# Patient Record
Sex: Female | Born: 1935 | Race: Black or African American | Hispanic: No | Marital: Single | Smoking: Never smoker
Health system: Southern US, Community
[De-identification: ages and names within clinical notes are randomized; demographics above are authoritative.]

## PROBLEM LIST (undated history)

## (undated) DIAGNOSIS — E785 Hyperlipidemia, unspecified: Secondary | ICD-10-CM

## (undated) DIAGNOSIS — I447 Left bundle-branch block, unspecified: Secondary | ICD-10-CM

## (undated) DIAGNOSIS — G4700 Insomnia, unspecified: Secondary | ICD-10-CM

## (undated) DIAGNOSIS — K5909 Other constipation: Secondary | ICD-10-CM

## (undated) DIAGNOSIS — D649 Anemia, unspecified: Secondary | ICD-10-CM

## (undated) DIAGNOSIS — M1710 Unilateral primary osteoarthritis, unspecified knee: Secondary | ICD-10-CM

## (undated) DIAGNOSIS — A048 Other specified bacterial intestinal infections: Secondary | ICD-10-CM

## (undated) DIAGNOSIS — I10 Essential (primary) hypertension: Secondary | ICD-10-CM

## (undated) DIAGNOSIS — K219 Gastro-esophageal reflux disease without esophagitis: Secondary | ICD-10-CM

## (undated) HISTORY — DX: Insomnia, unspecified: G47.00

## (undated) HISTORY — DX: Hyperlipidemia, unspecified: E78.5

## (undated) HISTORY — DX: Essential (primary) hypertension: I10

## (undated) HISTORY — DX: Gastro-esophageal reflux disease without esophagitis: K21.9

## (undated) HISTORY — DX: Anemia, unspecified: D64.9

## (undated) HISTORY — DX: Unilateral primary osteoarthritis, unspecified knee: M17.10

## (undated) HISTORY — DX: Other constipation: K59.09

## (undated) HISTORY — DX: Left bundle-branch block, unspecified: I44.7

## (undated) HISTORY — DX: Other specified bacterial intestinal infections: A04.8

## (undated) HISTORY — PX: EYE SURGERY: SHX253

---

## 1972-06-16 HISTORY — PX: BREAST LUMPECTOMY: SHX2

## 1982-06-16 HISTORY — PX: TOTAL ABDOMINAL HYSTERECTOMY W/ BILATERAL SALPINGOOPHORECTOMY: SHX83

## 2001-07-12 ENCOUNTER — Other Ambulatory Visit: Admission: RE | Admit: 2001-07-12 | Discharge: 2001-07-12 | Payer: Self-pay | Admitting: Family Medicine

## 2001-07-26 ENCOUNTER — Ambulatory Visit (HOSPITAL_COMMUNITY): Admission: RE | Admit: 2001-07-26 | Discharge: 2001-07-26 | Payer: Self-pay | Admitting: Family Medicine

## 2001-07-26 ENCOUNTER — Encounter: Payer: Self-pay | Admitting: Family Medicine

## 2001-08-19 ENCOUNTER — Encounter: Payer: Self-pay | Admitting: Family Medicine

## 2001-08-19 ENCOUNTER — Ambulatory Visit (HOSPITAL_COMMUNITY): Admission: RE | Admit: 2001-08-19 | Discharge: 2001-08-19 | Payer: Self-pay | Admitting: Family Medicine

## 2002-06-16 HISTORY — PX: OTHER SURGICAL HISTORY: SHX169

## 2002-07-14 ENCOUNTER — Ambulatory Visit (HOSPITAL_BASED_OUTPATIENT_CLINIC_OR_DEPARTMENT_OTHER): Admission: RE | Admit: 2002-07-14 | Discharge: 2002-07-14 | Payer: Self-pay | Admitting: Orthopedic Surgery

## 2002-07-28 ENCOUNTER — Ambulatory Visit (HOSPITAL_COMMUNITY): Admission: RE | Admit: 2002-07-28 | Discharge: 2002-07-28 | Payer: Self-pay | Admitting: Family Medicine

## 2002-07-28 ENCOUNTER — Encounter: Payer: Self-pay | Admitting: Family Medicine

## 2002-08-10 ENCOUNTER — Ambulatory Visit (HOSPITAL_BASED_OUTPATIENT_CLINIC_OR_DEPARTMENT_OTHER): Admission: RE | Admit: 2002-08-10 | Discharge: 2002-08-10 | Payer: Self-pay | Admitting: Orthopedic Surgery

## 2002-08-29 ENCOUNTER — Ambulatory Visit (HOSPITAL_COMMUNITY): Admission: RE | Admit: 2002-08-29 | Discharge: 2002-08-29 | Payer: Self-pay | Admitting: Family Medicine

## 2002-08-29 ENCOUNTER — Encounter: Payer: Self-pay | Admitting: Family Medicine

## 2002-09-14 ENCOUNTER — Encounter (HOSPITAL_COMMUNITY): Admission: RE | Admit: 2002-09-14 | Discharge: 2002-10-14 | Payer: Self-pay | Admitting: *Deleted

## 2003-08-01 ENCOUNTER — Ambulatory Visit (HOSPITAL_COMMUNITY): Admission: RE | Admit: 2003-08-01 | Discharge: 2003-08-01 | Payer: Self-pay | Admitting: Family Medicine

## 2003-08-25 ENCOUNTER — Ambulatory Visit (HOSPITAL_COMMUNITY): Admission: RE | Admit: 2003-08-25 | Discharge: 2003-08-25 | Payer: Self-pay | Admitting: Family Medicine

## 2004-06-16 HISTORY — PX: OTHER SURGICAL HISTORY: SHX169

## 2004-08-23 ENCOUNTER — Ambulatory Visit: Payer: Self-pay | Admitting: Family Medicine

## 2004-08-27 ENCOUNTER — Ambulatory Visit (HOSPITAL_COMMUNITY): Admission: RE | Admit: 2004-08-27 | Discharge: 2004-08-27 | Payer: Self-pay | Admitting: Family Medicine

## 2004-09-04 ENCOUNTER — Ambulatory Visit (HOSPITAL_COMMUNITY): Admission: RE | Admit: 2004-09-04 | Discharge: 2004-09-04 | Payer: Self-pay | Admitting: Family Medicine

## 2004-09-16 ENCOUNTER — Ambulatory Visit (HOSPITAL_COMMUNITY): Admission: RE | Admit: 2004-09-16 | Discharge: 2004-09-16 | Payer: Self-pay | Admitting: General Surgery

## 2004-12-26 ENCOUNTER — Ambulatory Visit: Payer: Self-pay | Admitting: Family Medicine

## 2005-03-12 ENCOUNTER — Ambulatory Visit: Payer: Self-pay | Admitting: Family Medicine

## 2005-03-21 ENCOUNTER — Ambulatory Visit (HOSPITAL_COMMUNITY): Admission: RE | Admit: 2005-03-21 | Discharge: 2005-03-21 | Payer: Self-pay | Admitting: *Deleted

## 2005-04-01 ENCOUNTER — Ambulatory Visit: Payer: Self-pay | Admitting: Family Medicine

## 2005-04-21 ENCOUNTER — Ambulatory Visit: Payer: Self-pay | Admitting: Orthopedic Surgery

## 2005-07-08 ENCOUNTER — Ambulatory Visit: Payer: Self-pay | Admitting: Family Medicine

## 2005-09-05 ENCOUNTER — Ambulatory Visit (HOSPITAL_COMMUNITY): Admission: RE | Admit: 2005-09-05 | Discharge: 2005-09-05 | Payer: Self-pay | Admitting: Family Medicine

## 2005-12-29 ENCOUNTER — Other Ambulatory Visit: Admission: RE | Admit: 2005-12-29 | Discharge: 2005-12-29 | Payer: Self-pay | Admitting: Family Medicine

## 2005-12-29 ENCOUNTER — Ambulatory Visit: Payer: Self-pay | Admitting: Family Medicine

## 2005-12-29 ENCOUNTER — Encounter (INDEPENDENT_AMBULATORY_CARE_PROVIDER_SITE_OTHER): Payer: Self-pay | Admitting: *Deleted

## 2006-03-26 ENCOUNTER — Ambulatory Visit: Payer: Self-pay | Admitting: Family Medicine

## 2006-06-16 HISTORY — PX: COLONOSCOPY: SHX174

## 2006-06-18 ENCOUNTER — Ambulatory Visit: Payer: Self-pay | Admitting: Family Medicine

## 2006-09-10 ENCOUNTER — Ambulatory Visit (HOSPITAL_COMMUNITY): Admission: RE | Admit: 2006-09-10 | Discharge: 2006-09-10 | Payer: Self-pay | Admitting: Family Medicine

## 2006-12-03 ENCOUNTER — Encounter: Payer: Self-pay | Admitting: Family Medicine

## 2006-12-03 LAB — CONVERTED CEMR LAB
AST: 18 units/L (ref 0–37)
Albumin: 4.3 g/dL (ref 3.5–5.2)
Alkaline Phosphatase: 64 units/L (ref 39–117)
Cholesterol: 190 mg/dL (ref 0–200)
HDL: 69 mg/dL (ref >39)
Indirect Bilirubin: 0.2 mg/dL (ref 0.0–0.9)
Total Protein: 7.3 g/dL (ref 6.0–8.3)
Triglycerides: 63 mg/dL (ref <150)

## 2007-01-14 ENCOUNTER — Ambulatory Visit: Payer: Self-pay | Admitting: Family Medicine

## 2007-01-14 ENCOUNTER — Other Ambulatory Visit: Admission: RE | Admit: 2007-01-14 | Discharge: 2007-01-14 | Payer: Self-pay | Admitting: Family Medicine

## 2007-01-15 ENCOUNTER — Encounter (INDEPENDENT_AMBULATORY_CARE_PROVIDER_SITE_OTHER): Payer: Self-pay | Admitting: *Deleted

## 2007-01-15 ENCOUNTER — Encounter: Payer: Self-pay | Admitting: Family Medicine

## 2007-06-17 ENCOUNTER — Encounter: Payer: Self-pay | Admitting: Family Medicine

## 2007-06-17 HISTORY — PX: OTHER SURGICAL HISTORY: SHX169

## 2007-07-02 ENCOUNTER — Ambulatory Visit: Payer: Self-pay | Admitting: Family Medicine

## 2007-07-02 ENCOUNTER — Ambulatory Visit (HOSPITAL_COMMUNITY): Admission: RE | Admit: 2007-07-02 | Discharge: 2007-07-02 | Payer: Self-pay | Admitting: Family Medicine

## 2007-07-02 LAB — CONVERTED CEMR LAB
AST: 19 units/L (ref 0–37)
Albumin: 4.6 g/dL (ref 3.5–5.2)
Alkaline Phosphatase: 55 units/L (ref 39–117)
Basophils Absolute: 0 10*3/uL (ref 0.0–0.1)
Basophils Relative: 0 % (ref 0–1)
CO2: 25 meq/L (ref 19–32)
Calcium: 9.6 mg/dL (ref 8.4–10.5)
Chloride: 104 meq/L (ref 96–112)
Creatinine, Ser: 1.12 mg/dL (ref 0.40–1.20)
Eosinophils Relative: 1 % (ref 0–5)
HDL: 66 mg/dL (ref >39)
Lymphocytes Relative: 41 % (ref 12–46)
MCHC: 33.7 g/dL (ref 30.0–36.0)
Neutro Abs: 1.8 10*3/uL (ref 1.7–7.7)
Platelets: 279 10*3/uL (ref 150–400)
RDW: 12.7 % (ref 11.5–15.5)
Sodium: 141 meq/L (ref 135–145)
Total Bilirubin: 0.6 mg/dL (ref 0.3–1.2)
Total CHOL/HDL Ratio: 2.7
Total Protein: 7.8 g/dL (ref 6.0–8.3)
Triglycerides: 67 mg/dL (ref <150)

## 2007-09-23 ENCOUNTER — Ambulatory Visit (HOSPITAL_COMMUNITY): Admission: RE | Admit: 2007-09-23 | Discharge: 2007-09-23 | Payer: Self-pay | Admitting: Family Medicine

## 2007-10-21 ENCOUNTER — Encounter: Payer: Self-pay | Admitting: Family Medicine

## 2007-10-21 LAB — CONVERTED CEMR LAB
AST: 20 units/L (ref 0–37)
Albumin: 4.2 g/dL (ref 3.5–5.2)
Alkaline Phosphatase: 54 units/L (ref 39–117)
Basophils Absolute: 0 10*3/uL (ref 0.0–0.1)
CO2: 25 meq/L (ref 19–32)
Calcium: 9.9 mg/dL (ref 8.4–10.5)
Chloride: 106 meq/L (ref 96–112)
Glucose, Bld: 88 mg/dL (ref 70–99)
HCT: 35.1 % — ABNORMAL LOW (ref 36.0–46.0)
Indirect Bilirubin: 0.3 mg/dL (ref 0.0–0.9)
Lymphocytes Relative: 45 % (ref 12–46)
Lymphs Abs: 1.4 10*3/uL (ref 0.7–4.0)
Neutro Abs: 1.3 10*3/uL — ABNORMAL LOW (ref 1.7–7.7)
Neutrophils Relative %: 42 % — ABNORMAL LOW (ref 43–77)
Platelets: 245 10*3/uL (ref 150–400)
Potassium: 4.3 meq/L (ref 3.5–5.3)
RDW: 13.4 % (ref 11.5–15.5)
Sodium: 142 meq/L (ref 135–145)
Total Protein: 7.1 g/dL (ref 6.0–8.3)
WBC: 3.2 10*3/uL — ABNORMAL LOW (ref 4.0–10.5)

## 2007-10-27 ENCOUNTER — Ambulatory Visit: Payer: Self-pay | Admitting: Family Medicine

## 2007-11-02 ENCOUNTER — Encounter (INDEPENDENT_AMBULATORY_CARE_PROVIDER_SITE_OTHER): Payer: Self-pay | Admitting: *Deleted

## 2007-11-02 DIAGNOSIS — M1710 Unilateral primary osteoarthritis, unspecified knee: Secondary | ICD-10-CM | POA: Insufficient documentation

## 2007-11-02 DIAGNOSIS — G4700 Insomnia, unspecified: Secondary | ICD-10-CM

## 2007-11-02 DIAGNOSIS — E785 Hyperlipidemia, unspecified: Secondary | ICD-10-CM

## 2007-11-02 DIAGNOSIS — I10 Essential (primary) hypertension: Secondary | ICD-10-CM

## 2007-11-02 DIAGNOSIS — IMO0002 Reserved for concepts with insufficient information to code with codable children: Secondary | ICD-10-CM

## 2007-11-02 DIAGNOSIS — D649 Anemia, unspecified: Secondary | ICD-10-CM

## 2007-11-24 ENCOUNTER — Ambulatory Visit (HOSPITAL_COMMUNITY): Payer: Self-pay | Admitting: Oncology

## 2007-11-24 ENCOUNTER — Encounter (HOSPITAL_COMMUNITY): Admission: RE | Admit: 2007-11-24 | Discharge: 2007-12-24 | Payer: Self-pay | Admitting: Oncology

## 2008-02-02 ENCOUNTER — Encounter: Payer: Self-pay | Admitting: Family Medicine

## 2008-02-07 ENCOUNTER — Telehealth: Payer: Self-pay | Admitting: Family Medicine

## 2008-02-15 HISTORY — PX: NM MYOVIEW LTD: HXRAD82

## 2008-02-15 HISTORY — PX: DOPPLER ECHOCARDIOGRAPHY: SHX263

## 2008-02-24 ENCOUNTER — Encounter: Payer: Self-pay | Admitting: Family Medicine

## 2008-03-28 ENCOUNTER — Ambulatory Visit: Payer: Self-pay | Admitting: Family Medicine

## 2008-03-28 ENCOUNTER — Ambulatory Visit (HOSPITAL_COMMUNITY): Admission: RE | Admit: 2008-03-28 | Discharge: 2008-03-28 | Payer: Self-pay | Admitting: Family Medicine

## 2008-03-28 DIAGNOSIS — H918X9 Other specified hearing loss, unspecified ear: Secondary | ICD-10-CM | POA: Insufficient documentation

## 2008-03-29 ENCOUNTER — Ambulatory Visit (HOSPITAL_COMMUNITY): Admission: RE | Admit: 2008-03-29 | Discharge: 2008-03-29 | Payer: Self-pay | Admitting: Family Medicine

## 2008-04-11 ENCOUNTER — Encounter: Payer: Self-pay | Admitting: Family Medicine

## 2008-04-18 ENCOUNTER — Encounter: Payer: Self-pay | Admitting: Family Medicine

## 2008-05-22 ENCOUNTER — Telehealth: Payer: Self-pay | Admitting: Family Medicine

## 2008-06-20 ENCOUNTER — Telehealth: Payer: Self-pay | Admitting: Family Medicine

## 2008-07-19 DIAGNOSIS — R5381 Other malaise: Secondary | ICD-10-CM

## 2008-07-19 DIAGNOSIS — R5383 Other fatigue: Secondary | ICD-10-CM

## 2008-07-20 ENCOUNTER — Ambulatory Visit: Payer: Self-pay | Admitting: Family Medicine

## 2008-07-20 LAB — CONVERTED CEMR LAB
AST: 21 units/L (ref 0–37)
BUN: 18 mg/dL (ref 6–23)
Bilirubin, Direct: 0.2 mg/dL (ref 0.0–0.3)
CO2: 26 meq/L (ref 19–32)
Calcium: 9.5 mg/dL (ref 8.4–10.5)
Glucose, Bld: 87 mg/dL (ref 70–99)
Indirect Bilirubin: 0.4 mg/dL (ref 0.0–0.9)
Sodium: 140 meq/L (ref 135–145)
TSH: 2.195 microintl units/mL (ref 0.350–4.50)
Total Bilirubin: 0.6 mg/dL (ref 0.3–1.2)
Total CHOL/HDL Ratio: 2.2
VLDL: 9 mg/dL (ref 0–40)

## 2008-07-24 ENCOUNTER — Telehealth: Payer: Self-pay | Admitting: Family Medicine

## 2008-07-25 ENCOUNTER — Encounter: Payer: Self-pay | Admitting: Family Medicine

## 2008-08-16 ENCOUNTER — Encounter: Payer: Self-pay | Admitting: Family Medicine

## 2008-09-26 ENCOUNTER — Ambulatory Visit (HOSPITAL_COMMUNITY): Admission: RE | Admit: 2008-09-26 | Discharge: 2008-09-26 | Payer: Self-pay | Admitting: Family Medicine

## 2008-11-22 ENCOUNTER — Encounter (HOSPITAL_COMMUNITY): Admission: RE | Admit: 2008-11-22 | Discharge: 2008-12-22 | Payer: Self-pay | Admitting: Oncology

## 2008-11-22 ENCOUNTER — Encounter: Payer: Self-pay | Admitting: Family Medicine

## 2008-11-22 ENCOUNTER — Ambulatory Visit (HOSPITAL_COMMUNITY): Payer: Self-pay | Admitting: Oncology

## 2008-11-23 ENCOUNTER — Ambulatory Visit: Payer: Self-pay | Admitting: Family Medicine

## 2008-11-27 ENCOUNTER — Encounter: Payer: Self-pay | Admitting: Family Medicine

## 2009-02-22 ENCOUNTER — Telehealth: Payer: Self-pay | Admitting: Family Medicine

## 2009-02-22 ENCOUNTER — Encounter: Payer: Self-pay | Admitting: Family Medicine

## 2009-02-22 LAB — CONVERTED CEMR LAB
Albumin: 4 g/dL (ref 3.5–5.2)
CO2: 25 meq/L (ref 19–32)
Chloride: 106 meq/L (ref 96–112)
Glucose, Bld: 82 mg/dL (ref 70–99)
HDL: 74 mg/dL (ref >39)
LDL Cholesterol: 61 mg/dL (ref 0–99)
Sodium: 143 meq/L (ref 135–145)
Total Bilirubin: 0.7 mg/dL (ref 0.3–1.2)
Total CHOL/HDL Ratio: 2

## 2009-03-28 ENCOUNTER — Ambulatory Visit: Payer: Self-pay | Admitting: Family Medicine

## 2009-05-16 HISTORY — PX: OTHER SURGICAL HISTORY: SHX169

## 2009-07-02 ENCOUNTER — Encounter: Payer: Self-pay | Admitting: Family Medicine

## 2009-07-06 LAB — CONVERTED CEMR LAB
Albumin: 4.3 g/dL (ref 3.5–5.2)
BUN: 20 mg/dL (ref 6–23)
CO2: 25 meq/L (ref 19–32)
Chloride: 103 meq/L (ref 96–112)
Eosinophils Relative: 1 % (ref 0–5)
Glucose, Bld: 98 mg/dL (ref 70–99)
HCT: 35.7 % — ABNORMAL LOW (ref 36.0–46.0)
Hemoglobin: 11.8 g/dL — ABNORMAL LOW (ref 12.0–15.0)
Indirect Bilirubin: 0.5 mg/dL (ref 0.0–0.9)
LDL Cholesterol: 108 mg/dL — ABNORMAL HIGH (ref 0–99)
Lymphocytes Relative: 40 % (ref 12–46)
Lymphs Abs: 1.4 10*3/uL (ref 0.7–4.0)
Monocytes Absolute: 0.5 10*3/uL (ref 0.1–1.0)
Monocytes Relative: 13 % — ABNORMAL HIGH (ref 3–12)
Potassium: 3.9 meq/L (ref 3.5–5.3)
RBC: 3.87 M/uL (ref 3.87–5.11)
Total Protein: 7.2 g/dL (ref 6.0–8.3)
Triglycerides: 65 mg/dL (ref <150)
VLDL: 13 mg/dL (ref 0–40)
WBC: 3.6 10*3/uL — ABNORMAL LOW (ref 4.0–10.5)

## 2009-09-14 IMAGING — CT CT ANGIO HEAD
1 of 4 series · 19 of 47 positions shown · IV contrast (Omnipaque 300)
Comparison: MRI 03/28/2008

CLINICAL DATA: Headache.  Possible aneurysm on MRI.

CT ANGIOGRAPHY HEAD
TECHNIQUE: Multidetector CT imaging of the head was performed
using the standard protocol prior to and during bolus
administration of intravenous contrast. Multiplanar CT image
reconstructions including MIPs were obtained to evaluate the
vascular anatomy.
Contrast: 100 ml Ymnipaque-0LL IV

[Series 8: headangio 0.6 h10f · axial · 0.40mm/px · z∈[+107,+235]mm · 19 of 346 slices shown]
[im 13/346  brain]
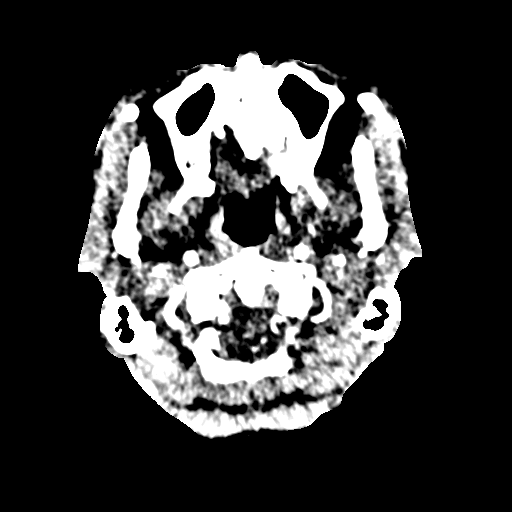
[im 39/346  bone]
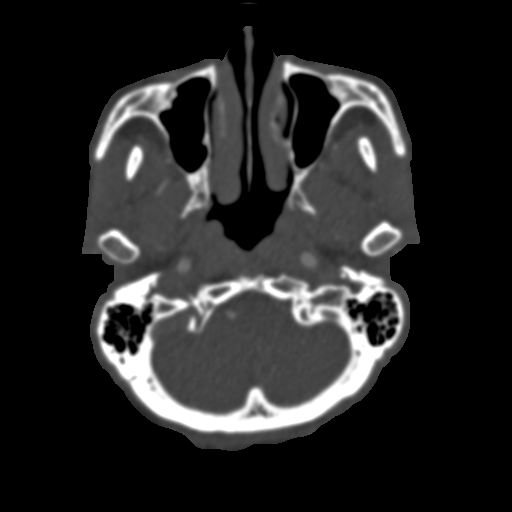
[im 52/346  brain]
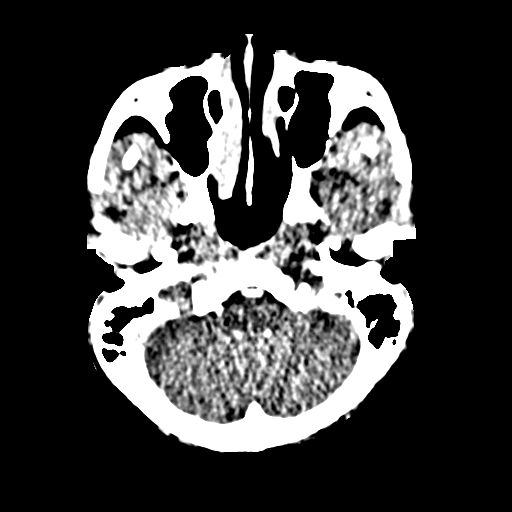
[im 64/346  bone]
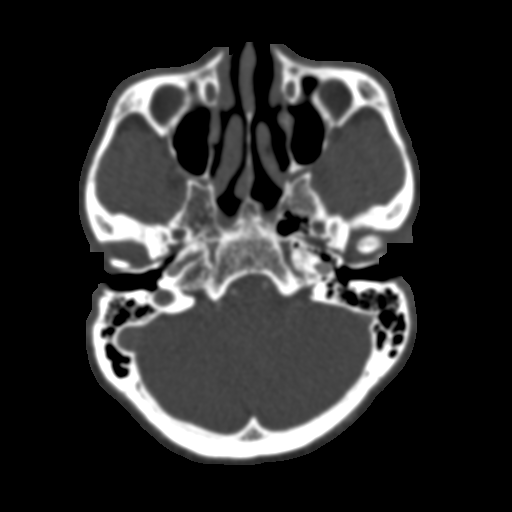
[im 90/346  brain]
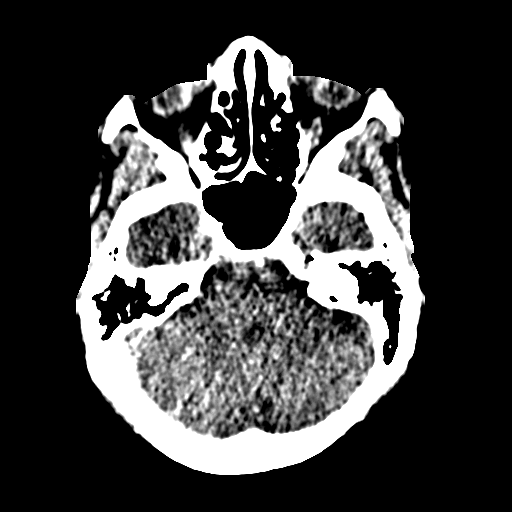
[im 103/346  bone]
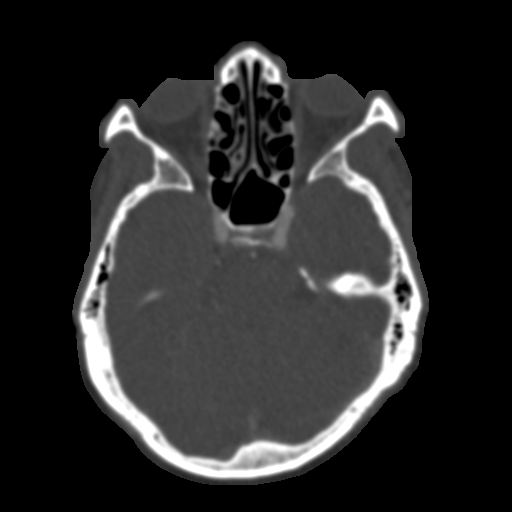
[im 116/346  brain]
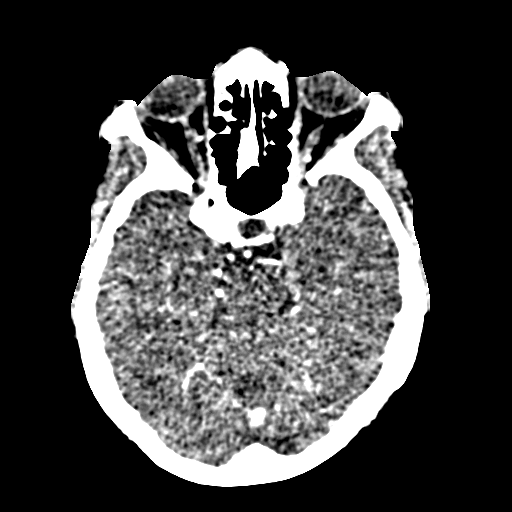
[im 141/346  bone]
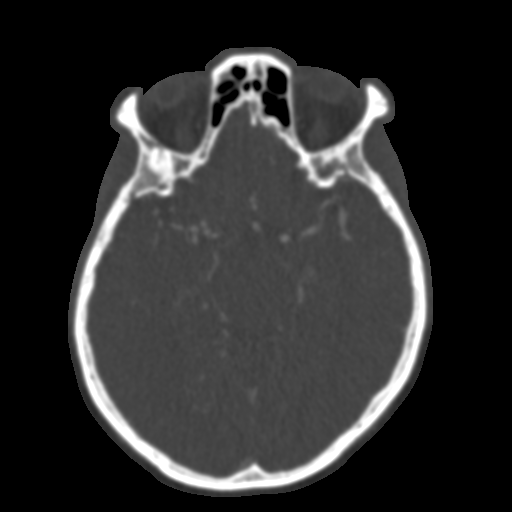
[im 154/346  brain]
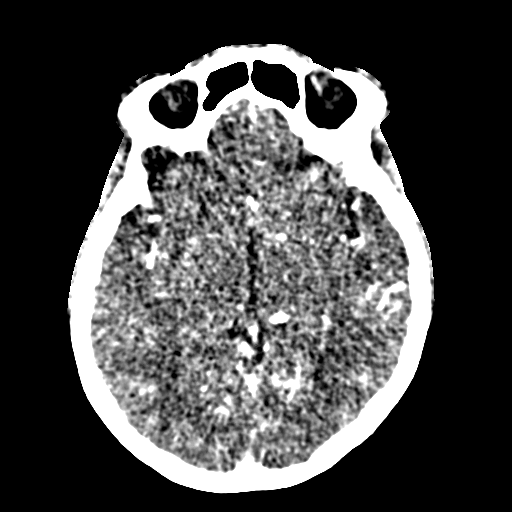
[im 179/346  bone]
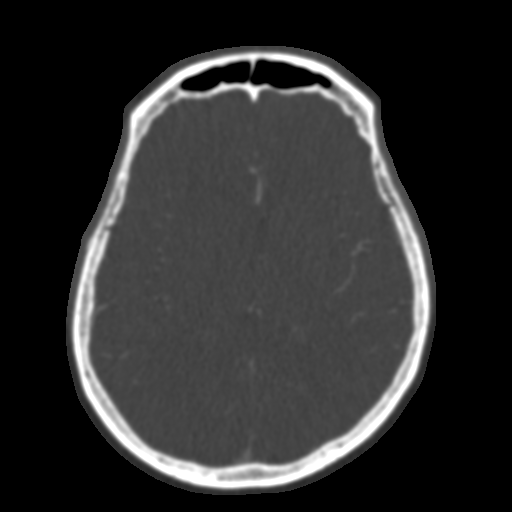
[im 192/346  brain]
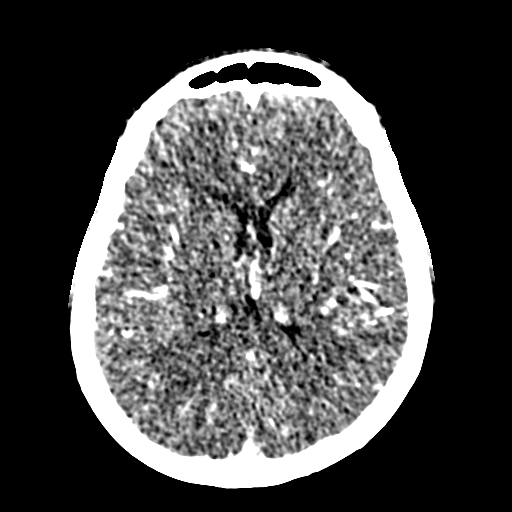
[im 205/346  bone]
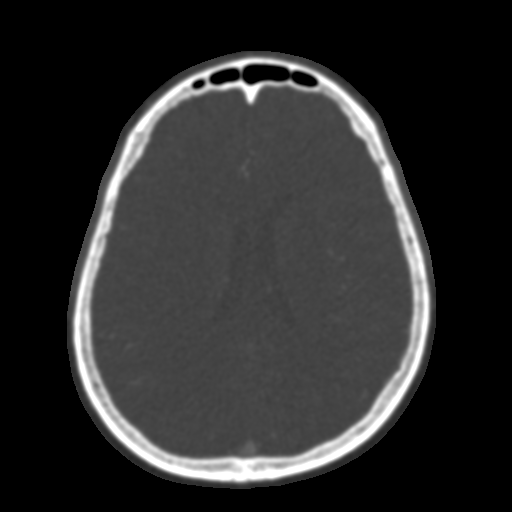
[im 231/346  brain]
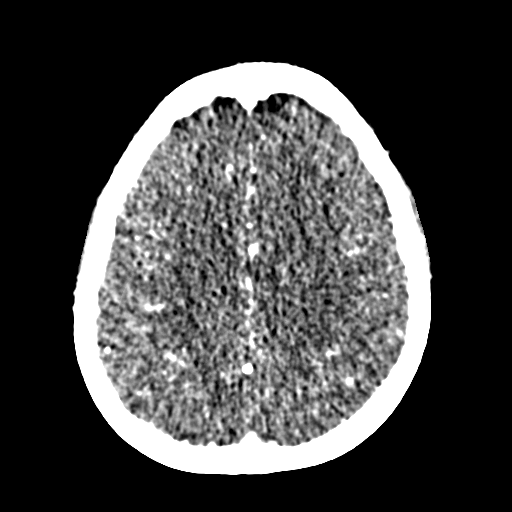
[im 243/346  bone]
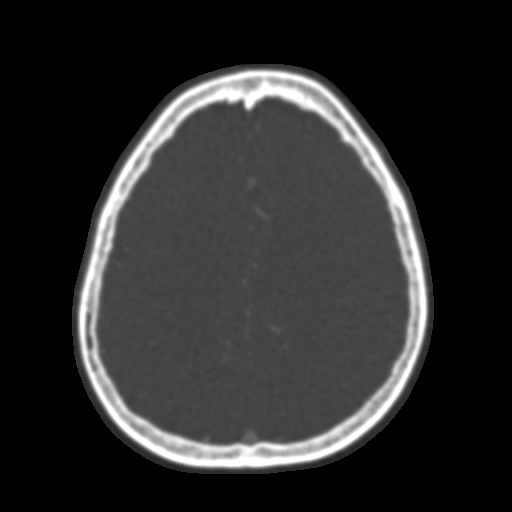
[im 256/346  brain]
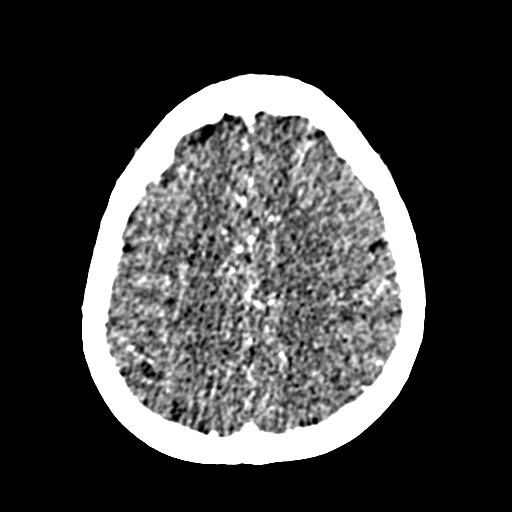
[im 282/346  bone]
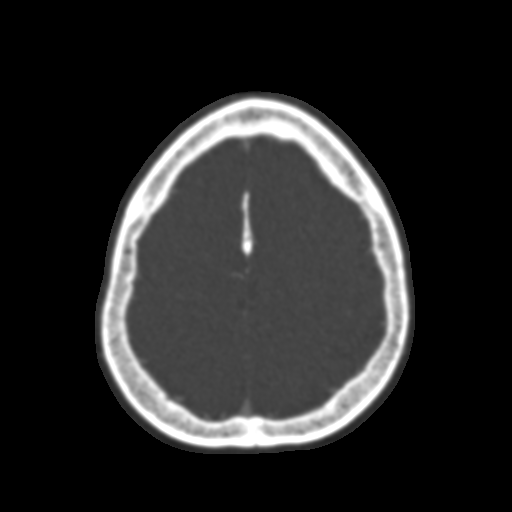
[im 294/346  brain]
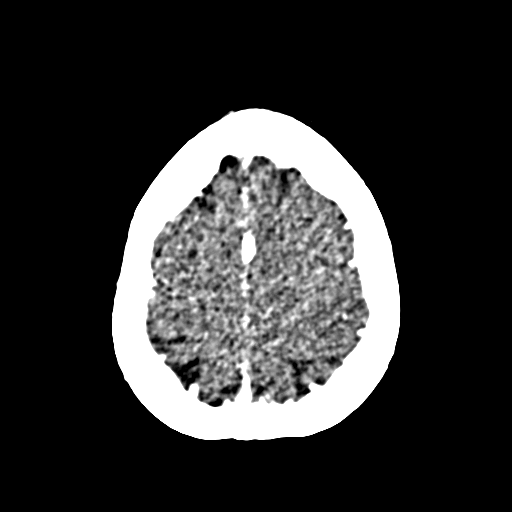
[im 307/346  bone]
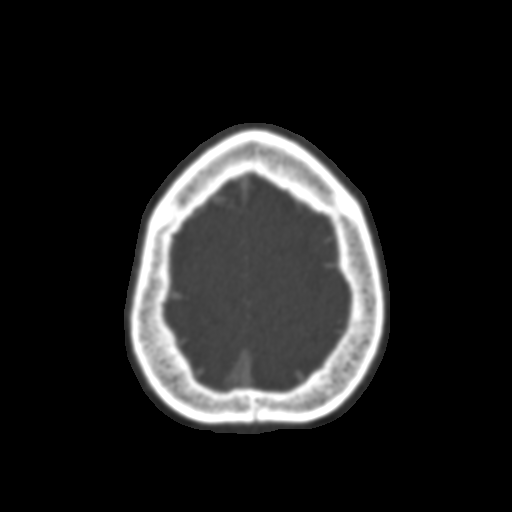
[im 333/346  brain]
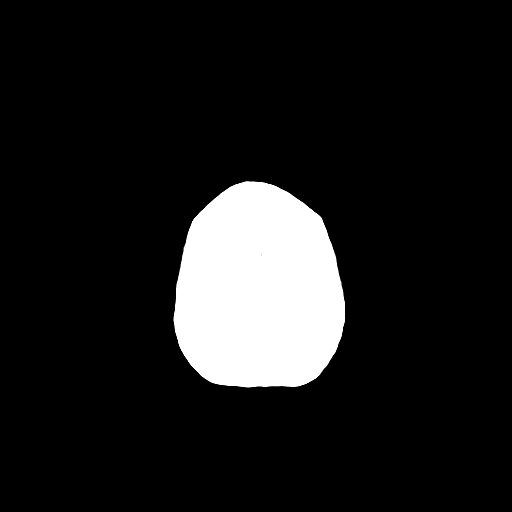

[19 of 47 positions shown; findings below may reference images not displayed]

FINDINGS: There is a complex vascular loop of the right middle
cerebral artery that corresponds to the flow void on MRI.  There is
a small amount of calcium in the vessel.  This appears to be a
vascular loop which turns 360 degrees.  This involves the right M1
segment and no aneurysm is identified.  There is mild calcification
in the loop which is probably due to atherosclerosis.  The vessel
is mildly ectatic in the area of the loop.

The right vertebral artery is tortuous and is patent.  The left
vertebral artery is small and appears to end in pica.  The basilar
and posterior cerebral arteries are patent bilaterally.  The
internal carotid arteries are patent bilaterally.  Both anterior
cerebral arteries are patent.  Both middle cerebral arteries are
patent.  There is no aneurysm.
IMPRESSION: There is a complex vascular loop involving the right M1 segment
which  accounts for the MRI finding.  There is early
atherosclerosis in this loop without significant stenosis.

Negative for cerebral aneurysm.

## 2009-10-08 ENCOUNTER — Telehealth (INDEPENDENT_AMBULATORY_CARE_PROVIDER_SITE_OTHER): Payer: Self-pay | Admitting: *Deleted

## 2009-10-08 ENCOUNTER — Telehealth: Payer: Self-pay | Admitting: Family Medicine

## 2009-10-22 ENCOUNTER — Ambulatory Visit (HOSPITAL_COMMUNITY): Admission: RE | Admit: 2009-10-22 | Discharge: 2009-10-22 | Payer: Self-pay | Admitting: Family Medicine

## 2009-10-22 ENCOUNTER — Encounter: Payer: Self-pay | Admitting: Family Medicine

## 2009-11-20 ENCOUNTER — Telehealth: Payer: Self-pay | Admitting: Family Medicine

## 2009-12-06 ENCOUNTER — Ambulatory Visit: Payer: Self-pay | Admitting: Family Medicine

## 2009-12-06 ENCOUNTER — Other Ambulatory Visit: Admission: RE | Admit: 2009-12-06 | Discharge: 2009-12-06 | Payer: Self-pay | Admitting: Family Medicine

## 2009-12-07 LAB — CONVERTED CEMR LAB
AST: 19 units/L (ref 0–37)
Albumin: 4.3 g/dL (ref 3.5–5.2)
Bilirubin, Direct: 0.1 mg/dL (ref 0.0–0.3)
CO2: 27 meq/L (ref 19–32)
Chloride: 102 meq/L (ref 96–112)
HDL: 68 mg/dL (ref >39)
LDL Cholesterol: 80 mg/dL (ref 0–99)
Lymphocytes Relative: 39 % (ref 12–46)
Lymphs Abs: 1.4 10*3/uL (ref 0.7–4.0)
MCV: 94.1 fL (ref 78.0–100.0)
Monocytes Relative: 11 % (ref 3–12)
Neutro Abs: 1.7 10*3/uL (ref 1.7–7.7)
Neutrophils Relative %: 48 % (ref 43–77)
Potassium: 3.7 meq/L (ref 3.5–5.3)
RBC: 3.89 M/uL (ref 3.87–5.11)
RDW: 13 % (ref 11.5–15.5)
Sodium: 139 meq/L (ref 135–145)
TSH: 1.437 microintl units/mL (ref 0.350–4.500)
Total Bilirubin: 0.6 mg/dL (ref 0.3–1.2)
Total CHOL/HDL Ratio: 2.4
VLDL: 16 mg/dL (ref 0–40)
WBC: 3.6 10*3/uL — ABNORMAL LOW (ref 4.0–10.5)

## 2009-12-09 DIAGNOSIS — R198 Other specified symptoms and signs involving the digestive system and abdomen: Secondary | ICD-10-CM | POA: Insufficient documentation

## 2009-12-12 ENCOUNTER — Encounter: Payer: Self-pay | Admitting: Family Medicine

## 2009-12-20 ENCOUNTER — Encounter: Payer: Self-pay | Admitting: Family Medicine

## 2010-01-08 ENCOUNTER — Ambulatory Visit: Payer: Self-pay | Admitting: Family Medicine

## 2010-01-08 DIAGNOSIS — M5430 Sciatica, unspecified side: Secondary | ICD-10-CM

## 2010-01-09 ENCOUNTER — Encounter (INDEPENDENT_AMBULATORY_CARE_PROVIDER_SITE_OTHER): Payer: Self-pay | Admitting: *Deleted

## 2010-03-05 ENCOUNTER — Ambulatory Visit: Payer: Self-pay | Admitting: Family Medicine

## 2010-05-23 ENCOUNTER — Ambulatory Visit: Payer: Self-pay | Admitting: Family Medicine

## 2010-05-23 DIAGNOSIS — R1013 Epigastric pain: Secondary | ICD-10-CM

## 2010-05-23 DIAGNOSIS — K3189 Other diseases of stomach and duodenum: Secondary | ICD-10-CM

## 2010-05-23 DIAGNOSIS — F329 Major depressive disorder, single episode, unspecified: Secondary | ICD-10-CM

## 2010-05-24 LAB — CONVERTED CEMR LAB
BUN: 23 mg/dL (ref 6–23)
Calcium: 9.6 mg/dL (ref 8.4–10.5)
Cholesterol: 247 mg/dL — ABNORMAL HIGH (ref 0–200)
Potassium: 3.9 meq/L (ref 3.5–5.3)
Sodium: 140 meq/L (ref 135–145)
Total CHOL/HDL Ratio: 3.7
Triglycerides: 61 mg/dL (ref <150)
VLDL: 12 mg/dL (ref 0–40)

## 2010-05-28 ENCOUNTER — Ambulatory Visit (HOSPITAL_COMMUNITY)
Admission: RE | Admit: 2010-05-28 | Discharge: 2010-05-28 | Payer: Self-pay | Source: Home / Self Care | Attending: Family Medicine | Admitting: Family Medicine

## 2010-05-28 ENCOUNTER — Encounter: Payer: Self-pay | Admitting: Family Medicine

## 2010-05-29 ENCOUNTER — Telehealth (INDEPENDENT_AMBULATORY_CARE_PROVIDER_SITE_OTHER): Payer: Self-pay | Admitting: *Deleted

## 2010-05-30 ENCOUNTER — Encounter (HOSPITAL_COMMUNITY)
Admission: RE | Admit: 2010-05-30 | Discharge: 2010-06-29 | Payer: Self-pay | Source: Home / Self Care | Attending: Family Medicine | Admitting: Family Medicine

## 2010-06-06 ENCOUNTER — Ambulatory Visit: Payer: Self-pay | Admitting: Gastroenterology

## 2010-06-11 ENCOUNTER — Encounter (INDEPENDENT_AMBULATORY_CARE_PROVIDER_SITE_OTHER): Payer: Self-pay | Admitting: *Deleted

## 2010-06-11 ENCOUNTER — Ambulatory Visit: Payer: Self-pay | Admitting: Internal Medicine

## 2010-06-11 DIAGNOSIS — K5900 Constipation, unspecified: Secondary | ICD-10-CM | POA: Insufficient documentation

## 2010-06-16 HISTORY — PX: ESOPHAGOGASTRODUODENOSCOPY: SHX1529

## 2010-06-20 ENCOUNTER — Ambulatory Visit (HOSPITAL_COMMUNITY)
Admission: RE | Admit: 2010-06-20 | Discharge: 2010-06-20 | Payer: Self-pay | Source: Home / Self Care | Attending: Internal Medicine | Admitting: Internal Medicine

## 2010-06-27 ENCOUNTER — Telehealth (INDEPENDENT_AMBULATORY_CARE_PROVIDER_SITE_OTHER): Payer: Self-pay | Admitting: *Deleted

## 2010-07-06 ENCOUNTER — Encounter: Payer: Self-pay | Admitting: *Deleted

## 2010-07-07 ENCOUNTER — Encounter: Payer: Self-pay | Admitting: Family Medicine

## 2010-07-16 NOTE — Progress Notes (Signed)
Summary: MEDICINE  Phone Note Call from Patient   Summary of Call: OUT OF HER ENALAPRIL   CALL HER BACK TO SEE WHERE TO SEND IT I COULD NOT UNDERSTAND ON THE MESSAGE SHE LEFT Initial call taken by: Lind Guest,  November 20, 2009 1:34 PM  Follow-up for Phone Call        Rx Called In Follow-up by: Adella Hare LPN,  November 21, 2954 2:03 PM    Prescriptions: ENALAPRIL-HYDROCHLOROTHIAZIDE 10-25 MG  TABS (ENALAPRIL-HYDROCHLOROTHIAZIDE) one tab by mouth once daily  #30 x 0   Entered by:   Adella Hare LPN   Authorized by:   Syliva Overman MD   Signed by:   Adella Hare LPN on 21/30/8657   Method used:   Electronically to        [facility] [phone]* (retail)       [address]       [place], [place]  [address]       Ph: [phone] or [phone]       Fax: [phone]   RxID:   [id number]

## 2010-07-16 NOTE — Progress Notes (Signed)
  Phone Note Outgoing Call   Call placed by: dr simpsmpson Summary of Call: msg left that pt will have her meds refill;ed.  Follow-up for Phone Call        pls sched appt june 11 or after for cpe if she agrees if niot reg f/u as she has not been here for mD visit since June 2010 Follow-up by: Syliva Overman MD,  October 08, 2009 8:14 AM  Additional Follow-up for Phone Call Additional follow up Details #1::        left message Additional Follow-up by: Lind Guest,  October 08, 2009 9:40 AM    Additional Follow-up for Phone Call Additional follow up Details #2::    SHE CAME BY AND WE SCHEDULED HER AN APPT FOR JUNE FOR A PHY Follow-up by: Lind Guest,  October 08, 2009 4:16 PM  Additional Follow-up for Phone Call Additional follow up Details #3:: Details for Additional Follow-up Action Taken: NOTED, AND THANKS Additional Follow-up by: Syliva Overman MD,  October 08, 2009 10:28 PM

## 2010-07-16 NOTE — Letter (Signed)
Summary: Historic consulants  Historic consulants   Imported By: Lind Guest 10/22/2009 14:01:26  _____________________________________________________________________  External Attachment:    Type:   Image     Comment:   External Document

## 2010-07-16 NOTE — Letter (Signed)
Summary: Historic labs  Historic labs   Imported By: Lind Guest 10/22/2009 14:03:33  _____________________________________________________________________  External Attachment:    Type:   Image     Comment:   External Document

## 2010-07-16 NOTE — Letter (Signed)
Summary: Historic  progress notes  Historic  progress notes   Imported By: Lind Guest 10/22/2009 14:06:25  _____________________________________________________________________  External Attachment:    Type:   Image     Comment:   External Document

## 2010-07-16 NOTE — Miscellaneous (Signed)
Summary: lab order  Clinical Lists Changes  Orders: Added new Test order of T-Basic Metabolic Panel [phone]) - Signed Added new Test order of T-Hepatic Function [phone]) - Signed Added new Test order of T-Lipid Profile ([id number]) - Signed Added new Test order of T-CBC w/Diff ([id number]) - Signed

## 2010-07-16 NOTE — Assessment & Plan Note (Signed)
Summary: ov   Vital Signs:  Patient profile:   75 year old female Menstrual status:  hysterectomy Height:      62 inches Weight:      138.25 pounds BMI:     25.38 O2 Sat:      94 % on Room air Pulse rate:   69 / minute Resp:     16 per minute BP sitting:   126 / 80  (left arm) Cuff size:   regular  Vitals Entered By: Everitt Amber LPN (January 08, 2010 1:02 PM)  O2 Flow:  Room air CC: right hip pain that runs all the way down her leg that comes and goes. When it comes, its awful   CC:  right hip pain that runs all the way down her leg that comes and goes. When it comes and its awful.  History of Present Illness: Pt was bending gardfening 6 days ago and since then she has had sharp shooting pains down her right buttock and poterior thigh to the foot. she does have established lower disc disease, but has had no flares for over 5 years.She denies lower extremity weakness or numbness, she denies incontinence of stool or urine. Prior to this she had been fine.   Current Medications (verified): 1)  Simvastatin 40 Mg  Tabs (Simvastatin) .... One Tab By Mouth At Bedtime 2)  Enalapril-Hydrochlorothiazide 10-25 Mg  Tabs (Enalapril-Hydrochlorothiazide) .... One Tab By Mouth Once Daily 3)  Aspirin 325 Mg Tabs (Aspirin) .... Take 1 Tablet By Mouth Every Other Day 4)  Betagan 0.5 % Soln (Levobunolol Hcl) .... One Drop in Right Eye Two Times A Day 5)  Azopt 1 % Susp (Brinzolamide) .... One Drop in Right Eye Two Times A Day  Allergies (verified): No Known Drug Allergies  Review of Systems General:  Denies chills and fever. ENT:  Denies hoarseness, nasal congestion, sinus pressure, and sore throat. CV:  Denies chest pain or discomfort, palpitations, and swelling of feet. Resp:  Denies cough and sputum productive.  Physical Exam  General:  Well-developed,well-nourished,in no acute distress; alert,appropriate and cooperative throughout examination HEENT: No facial asymmetry,  EOMI, No sinus  tenderness, TM's Clear, oropharynx  pink and moist.   Chest: Clear to auscultation bilaterally.  CVS: S1, S2, No murmurs, No S3.   Abd: Soft, Nontender.  YN:WGNFAOZHY  ROM spine, hips, shoulders and knees.  Ext: No edema.   CNS: CN 2-12 intact, power tone and sensation normal throughout.   Skin: Intact, no visible lesions or rashes.  Psych: Good eye contact, normal affect.  Memory intact, not anxious or depressed appearing.    Impression & Recommendations:  Problem # 1:  SCIATICA (ICD-724.3) Assessment Comment Only  Her updated medication list for this problem includes:    Aspirin 325 Mg Tabs (Aspirin) .Marland Kitchen... Take 1 tablet by mouth every other day    Ibuprofen 800 Mg Tabs (Ibuprofen) .Marland Kitchen... Take 1 tablet by mouth three times a day  Orders: Depo- Medrol 80mg  (J1040) Ketorolac-Toradol 15mg  (Q6578) Admin of Therapeutic Inj  intramuscular or subcutaneous (46962)  Her updated medication list for this problem includes:    Aspirin 325 Mg Tabs (Aspirin) .Marland Kitchen... Take 1 tablet by mouth every other day    Ibuprofen 800 Mg Tabs (Ibuprofen) .Marland Kitchen... Take 1 tablet by mouth three times a day  Problem # 2:  HYPERTENSION (ICD-401.9) Assessment: Unchanged  Her updated medication list for this problem includes:    Enalapril-hydrochlorothiazide 10-25 Mg Tabs (Enalapril-hydrochlorothiazide) ..... One tab by  mouth once daily  BP today: 126/80 Prior BP: 122/70 (12/06/2009)  Labs Reviewed: K+: 3.7 (12/06/2009) Creat: : 1.00 (12/06/2009)   Chol: 164 (12/06/2009)   HDL: 68 (12/06/2009)   LDL: 80 (12/06/2009)   TG: 81 (12/06/2009)  Complete Medication List: 1)  Simvastatin 40 Mg Tabs (Simvastatin) .... One tab by mouth at bedtime 2)  Enalapril-hydrochlorothiazide 10-25 Mg Tabs (Enalapril-hydrochlorothiazide) .... One tab by mouth once daily 3)  Aspirin 325 Mg Tabs (Aspirin) .... Take 1 tablet by mouth every other day 4)  Betagan 0.5 % Soln (Levobunolol hcl) .... One drop in right eye two times a day 5)   Azopt 1 % Susp (Brinzolamide) .... One drop in right eye two times a day 6)  Ibuprofen 800 Mg Tabs (Ibuprofen) .... Take 1 tablet by mouth three times a day 7)  Prednisone (pak) 5 Mg Tabs (Prednisone) .... Use as directed 8)  Omeprazole 20 Mg Cpdr (Omeprazole) .... Take 1 capsule by mouth once a day for 2 weeks, then as needed  Patient Instructions: 1)  F/U as before. 2)  You are being treated for acute sciatica, meds are being sent   in and you will also get injections. 3)  Pls call in the next 3 days if you are not better in the next 3 days. 4)  Pls take all meds as prescibed. 5)  pLS start the meds today Prescriptions: OMEPRAZOLE 20 MG CPDR (OMEPRAZOLE) Take 1 capsule by mouth once a day for 2 weeks, then as needed  #30 x 0   Entered and Authorized by:   Syliva Overman MD   Signed by:   Syliva Overman MD on 01/08/2010   Method used:   Electronically to        [facility] [phone]* (retail)       [address]       [place], [place]  [address]       Ph: [phone] or [phone]       Fax: [phone]   RxID:   [id number] PREDNISONE (PAK) 5 MG TABS (PREDNISONE) Use as directed  #21 x 0   Entered and Authorized by:   Syliva Overman MD   Signed by:   Syliva Overman MD on 01/08/2010   Method used:   Electronically to        [facility] [phone]* (retail)       [address]       [place], [place]  [address]       Ph: [phone] or [phone]       Fax: [phone]   RxID:   [id number] IBUPROFEN 800 MG TABS (IBUPROFEN) Take 1 tablet by mouth three times a day  #30 x 0   Entered and Authorized by:   Syliva Overman MD   Signed by:   Syliva Overman MD on 01/08/2010   Method used:   Electronically to        [facility] [phone]* (retail)       [address]       [place], [place]  [address]       Ph: [phone] or [phone]       Fax:  [phone]   RxID:   [id number]    Medication Administration  Injection # 1:    Medication:  Depo- Medrol 80mg     Diagnosis: SCIATICA (ICD-724.3)    Route: IM    Site: RUOQ gluteus    Exp Date: 10/2010    Lot #: [id number]    Mfr: Pharmacia    Comments: 80mg  given     Patient tolerated injection without complications    Given by: Everitt Amber LPN (January 08, 2010 1:38 PM)  Injection # 2:    Medication: Ketorolac-Toradol 15mg     Diagnosis: SCIATICA (ICD-724.3)    Route: IM    Site: RUOQ gluteus    Exp Date: 08/2011    Lot #: [id number]     Mfr: novaplus    Comments: 60mg  given     Patient tolerated injection without complications    Given by: Everitt Amber LPN (January 08, 2010 1:39 PM)  Orders Added: 1)  Est. Patient Level III [[address]] 2)  Depo- Medrol 80mg  [J1040] 3)  Ketorolac-Toradol 15mg  [J1885] 4)  Admin of Therapeutic Inj  intramuscular or subcutaneous [[address]]

## 2010-07-16 NOTE — Letter (Signed)
Summary: Unable to Reach, Consult Scheduled  [facility]  [address]   [place], [place] [address]   Phone: [phone]  Fax: [phone]    01/09/2010  Amanda [name] [address] [place], [place]  [address] [birth year]   Dear [name],   We have been unable to reach you by phone.  Please contact our office with an updated phone number.  At the recommendation of DR Alvira  we have been asked to schedule you a consult with DR Jena Gauss OR DR FIELDS for RECENT CHANGE IN BOWEL HABITS.  Please call our office at [phone].     Thank you,    Diana Eves  [facility] R. Roetta Sessions, M.D.    Jonette Eva, M.D. Lorenza Burton, FNP-BC    Tana Coast, PA-C Phone: [phone]    Fax: [phone]

## 2010-07-16 NOTE — Letter (Signed)
Summary: Historic x ray  Historic x ray   Imported By: Lind Guest 10/22/2009 14:07:46  _____________________________________________________________________  External Attachment:    Type:   Image     Comment:   External Document

## 2010-07-16 NOTE — Progress Notes (Signed)
[facility]   Imported By: Lind Guest 12/24/2009 14:21:47  _____________________________________________________________________  External Attachment:    Type:   Image     Comment:   External Document

## 2010-07-16 NOTE — Letter (Signed)
Summary: Historic mis  Historic mis   Imported By: Lind Guest 10/22/2009 14:04:31  _____________________________________________________________________  External Attachment:    Type:   Image     Comment:   External Document

## 2010-07-16 NOTE — Assessment & Plan Note (Signed)
Summary: flu shot  Nurse Visit   Allergies: No Known Drug Allergies  Immunizations Administered:  Influenza Vaccine # 1:    Vaccine Type: Fluvax MCR    Site: left deltoid    Mfr: novartis    Dose: 0.5 ml    Route: IM    Given by: Adella Hare LPN    Exp. Date: 10/2010    Lot #: [id number]    VIS given: 01/08/10 version given March 05, 2010.  Orders Added: 1)  Influenza Vaccine MCR [00025]

## 2010-07-16 NOTE — Progress Notes (Signed)
Summary: medicine  Phone Note Call from Patient   Summary of Call: wants you tocll in her enalapril at [facility] [address] call her back at [phone] Initial call taken by: Lind Guest,  October 08, 2009 7:54 AM  Follow-up for Phone Call        med sent in but can't reach her on the number provided to tell her she needs app.  Called home number and left message to call office Luann to schedule all for June Follow-up by: Everitt Amber LPN,  October 08, 2009 1:20 PM    +Prescriptions: ENALAPRIL-HYDROCHLOROTHIAZIDE 10-25 MG  TABS (ENALAPRIL-HYDROCHLOROTHIAZIDE) one tab by mouth once daily  #90 x 1   Entered by:   Everitt Amber LPN   Authorized by:   Syliva Overman MD   Signed by:   Everitt Amber LPN on 84/13/2440   Method used:   Electronically to        [facility] [phone]* (retail)       [address]       [place], [place]  [address]       Ph: [phone] or [phone]       Fax: [phone]   RxID:   [id number]

## 2010-07-16 NOTE — Letter (Signed)
Summary: Historic history & phy  Historic history & phy   Imported By: Lind Guest 10/22/2009 14:03:00  _____________________________________________________________________  External Attachment:    Type:   Image     Comment:   External Document

## 2010-07-16 NOTE — Letter (Signed)
Summary: Historic dem  Historic dem   Imported By: Lind Guest 10/22/2009 14:02:29  _____________________________________________________________________  External Attachment:    Type:   Image     Comment:   External Document

## 2010-07-16 NOTE — Op Note (Signed)
NAME:  Amanda [name], Amanda [name]                ACCOUNT NO.:  [id number]  MEDICAL RECORD NO.:  [id number]          PATIENT TYPE:  AMB  LOCATION:  DAY                           FACILITY:  [facility]  PHYSICIAN:  R. Roetta Sessions, M.D. DATE OF BIRTH:  [birth year]  DATE OF PROCEDURE:  06/20/2010 DATE OF DISCHARGE:                              OPERATIVE REPORT   PROCEDURE:  EGD biopsy followed by colonoscopy, diagnostic.  INDICATIONS FOR PROCEDURE:  A 75 year old lady with recent epigastric pain consistent with dyspepsia, now much improved on Prilosec 20 mg orally daily.  She does take 325 mg aspirin every other day.  No dysphagia, but occasional reflux symptoms, but doing much better again since started on Prilosec recently.  Has had some recent change in bowel habits with constipation.  No rectal bleeding.  Prior colonoscopy several years ago by Dr. Lovell Sheehan.  Colonoscopy and EGD now being done. Risks, benefits, limitations, alternatives, and imponderables have  been discussed, questions answered.  Please see the documentation of the medical record.  PROCEDURE NOTE:  O2 saturation, blood pressure, pulse, respirations monitored throughout the entirety of the both procedures.  CONSCIOUS SEDATION:  Versed 5 mg IV, Demerol 50 mg IV in divided doses. Cetacaine spray for topical pharyngeal anesthesia.  INSTRUMENT:  Pentax video chip system.  FINDINGS:  EGD examination of tubular esophagus revealed no mucosal abnormalities.  EG junction easily traversed.  Stomach:  Gastric cavity was emptied and insufflated well with air. Thorough examination of the gastric mucosa including retroflexion of proximal stomach, esophagogastric junction demonstrated multiple 1-3 mm antral erosions with some scarring.  There was no infiltrating process seen.  The remainder of gastric mucosa appeared normal.  Pylorus was patent, easily traversed.  Examination of the bulb and second portion revealed no  abnormalities.  THERAPEUTIC/DIAGNOSTIC MANEUVERS PERFORMED:  Biopsies of the abnormal antral mucosa were taken for histologic study.  The patient tolerated procedure well, was prepared for colonoscopy.  Digital rectal exam revealed no abnormalities.  Endoscopic findings:  Prep was good.  The colonic mucosa was surveyed from the rectosigmoid junction through the left transverse right colon to the appendiceal orifice, ileocecal valve/cecum.  These structures were well seen and photographed for the record.  From this level, scope was slowly and cautiously withdrawn. All previously mentioned mucosal surfaces were again seen.  The colonic mucosa appeared normal.  Scope was pulled down to the rectum where a thorough examination of rectal mucosa including retroflexed view of the anal verge demonstrated no abnormalities.  The patient tolerated the procedure well.  Cecal withdrawal time 7 minutes.  IMPRESSION: 1. Normal esophagus. 2. Multiple antral erosions status post biopsy otherwise unremarkable     stomach, normal D1 and D2.  COLONOSCOPY FINDINGS:  Normal rectum and colon.  RECOMMENDATIONS: 1. Continue Prilosec 20 mg orally daily because she is continuing     aspirin. 2. Follow up path, rule out H. pylori. 3. Continue bowel regimen including probiotic supplement, daily fiber,     and MiraLax 17 g orally at bedtime p.r.n. constipation. 4. Consider screening colonoscopy in 10 years.     Jonathon Bellows, M.D.  RMR/MEDQ  D:  06/20/2010  T:  06/20/2010  Job:  [id number]  cc:   Milus Mallick. Lodema Hong, M.D. Fax: [phone]  Electronically Signed by Lorrin Goodell M.D. on 07/16/2010 09:08:51 AM

## 2010-07-16 NOTE — Letter (Signed)
Summary: Historic phone notes  Historic phone notes   Imported By: Lind Guest 10/22/2009 14:05:48  _____________________________________________________________________  External Attachment:    Type:   Image     Comment:   External Document

## 2010-07-16 NOTE — Letter (Signed)
Summary: Letter  Letter   Imported By: Lind Guest 12/12/2009 14:22:43  _____________________________________________________________________  External Attachment:    Type:   Image     Comment:   External Document

## 2010-07-16 NOTE — Assessment & Plan Note (Signed)
Summary: PHY   Vital Signs:  Patient profile:   75 year old female Menstrual status:  hysterectomy Height:      62 inches Weight:      137 pounds BMI:     25.15 O2 Sat:      99 % Pulse rate:   61 / minute Pulse rhythm:   regular Resp:     16 per minute BP sitting:   122 / 70  (left arm) Cuff size:   regular  Vitals Entered By: Everitt Amber LPN (December 06, 2009 7:57 AM)  Nutrition Counseling: Patient's BMI is greater than 25 and therefore counseled on weight management options. CC: CPE  Vision Screening:Left eye with correction: 20 / 30 Right eye with correction: 20 / 30 Both eyes with correction: 20 / 20  Color vision testing: normal      Vision Entered By: Everitt Amber LPN (December 06, 2009 8:03 AM)   CC:  CPE.  History of Present Illness: Reports  that she has been doing fairly well. Denies recent fever or chills. Denies sinus pressure, nasal congestion , ear pain or sore throat. Denies chest congestion, or cough productive of sputum. Denies chest pain, palpitations, PND, orthopnea or leg swelling. Denies abdominal pain, nausea, vomitting, diarrhea or constipation.  Denies dysuria , frequency, incontinence or hesitancy.  Denies headaches, vertigo, seizures. Denies depression, anxiety or insomnia. Denies  rash, lesions, or itch. Recently dx with glaucoma in right eye after cataract extraction     Current Medications (verified): 1)  Simvastatin 40 Mg  Tabs (Simvastatin) .... One Tab By Mouth At Bedtime 2)  Enalapril-Hydrochlorothiazide 10-25 Mg  Tabs (Enalapril-Hydrochlorothiazide) .... One Tab By Mouth Once Daily 3)  Aspirin 325 Mg Tabs (Aspirin) .... Take 1 Tablet By Mouth Every Other Day 4)  Betagan 0.5 % Soln (Levobunolol Hcl) .... One Drop in Right Eye Two Times A Day 5)  Azopt 1 % Susp (Brinzolamide) .... One Drop in Right Eye Two Times A Day  Allergies (verified): No Known Drug Allergies  Past History:  Past Medical History: Current Problems:  ANEMIA  (ICD-285.9) ARTHRITIS, KNEE (ICD-716.96) INSOMNIA (ICD-780.52) HYPERLIPIDEMIA (ICD-272.4) HYPERTENSION (ICD-401.9) Glaucoma   dec 2010 Dr Cyd Silence  Past Surgical History: Bilateral carpal tunnel surgery (2004) Lumpectomy Left breast benign (1974) TAH and BSO (1984) Right knee aspiration (2006) Cataract extraction right eye dec 2010 Dr Jettie Pagan rotator cuff surgery right   2001/2002  Review of Systems      See HPI General:  Complains of sleep disorder. Eyes:  Complains of vision loss-both eyes; denies blurring and discharge; recently diagnosed with glaucoma following cataRACTSURGERY. GI:  Complains of change in bowel habits; recnt change in bowel movements to "balls". MS:  Complains of low back pain and stiffness; occasional low back pain nd intermittent right hip stiffness. Psych:  Denies anxiety and depression. Endo:  Denies cold intolerance, excessive hunger, excessive thirst, excessive urination, heat intolerance, polyuria, and weight change. Heme:  Denies abnormal bruising and bleeding. Allergy:  Denies hives or rash and itching eyes.  Physical Exam  General:  Well-developed,well-nourished,in no acute distress; alert,appropriate and cooperative throughout examination Head:  Normocephalic and atraumatic without obvious abnormalities. No apparent alopecia or balding. Eyes:  vision grossly intact, pupils equal, pupils round, and pupils reactive to light.   Ears:  External ear exam shows no significant lesions or deformities.  Otoscopic examination reveals clear canals, tympanic membranes are intact bilaterally without bulging, retraction, inflammation or discharge. Hearing is grossly normal bilaterally. Nose:  External  nasal examination shows no deformity or inflammation. Nasal mucosa are pink and moist without lesions or exudates. Mouth:  Oral mucosa and oropharynx without lesions or exudates.  Teeth in good repair. Neck:  No deformities, masses, or tenderness noted. Chest Wall:  No  deformities, masses, or tenderness noted. Breasts:  No mass, nodules, thickening, tenderness, bulging, retraction, inflamation, nipple discharge or skin changes noted.   Lungs:  Normal respiratory effort, chest expands symmetrically. Lungs are clear to auscultation, no crackles or wheezes. Heart:  Normal rate and regular rhythm. S1 and S2 normal without gallop, murmur, click, rub or other extra sounds. Abdomen:  Bowel sounds positive,abdomen soft and non-tender without masses, organomegaly or hernias noted. Rectal:  No external abnormalities noted. Normal sphincter tone. No rectal masses or tenderness.guaic neg stool Genitalia:  normal introitus, mucosa pink and moist, and no adnexal masses or tenderness.  Utyerus absent Msk:  No deformity or scoliosis noted of thoracic or lumbar spine.   Pulses:  R and L carotid,radial,femoral,dorsalis pedis and posterior tibial pulses are full and equal bilaterally Extremities:  No clubbing, cyanosis, edema, or deformity noted with normal full range of motion of all joints.   Neurologic:  No cranial nerve deficits noted. Station and gait are normal. Plantar reflexes are down-going bilaterally. DTRs are symmetrical throughout. Sensory, motor and coordinative functions appear intact. Skin:  Intact without suspicious lesions or rashes Cervical Nodes:  No lymphadenopathy noted Axillary Nodes:  No palpable lymphadenopathy Inguinal Nodes:  No significant adenopathy Psych:  Cognition and judgment appear intact. Alert and cooperative with normal attention span and concentration. No apparent delusions, illusions, hallucinations   Impression & Recommendations:  Problem # 1:  OTHER SYMPTOMS INVOLVING DIGESTIVE SYSTEM OTHER (ICD-787.99) Assessment Comment Only  Orders: Gastroenterology Referral (GI)  Problem # 2:  INSOMNIA (ICD-780.52) Assessment: Improved  Discussed sleep hygiene. pT NOT RELIANT ON DAIL;Y SLEEP AIDS ANYMORE.  Problem # 3:  HYPERTENSION  (ICD-401.9) Assessment: Unchanged  Her updated medication list for this problem includes:    Enalapril-hydrochlorothiazide 10-25 Mg Tabs (Enalapril-hydrochlorothiazide) ..... One tab by mouth once daily  Orders: T-Basic Metabolic Panel ([id number])  BP today: 122/70 Prior BP: 110/74 (11/23/2008)  Labs Reviewed: K+: 3.9 (07/04/2009) Creat: : 1.07 (07/04/2009)   Chol: 178 (07/04/2009)   HDL: 57 (07/04/2009)   LDL: 108 (07/04/2009)   TG: 65 (07/04/2009)  Problem # 4:  HYPERLIPIDEMIA (ICD-272.4) Assessment: Comment Only  Her updated medication list for this problem includes:    Simvastatin 40 Mg Tabs (Simvastatin) ..... One tab by mouth at bedtime  Orders: T-Hepatic Function [id number]) T-Lipid Profile [id number])  Labs Reviewed: SGOT: 19 (07/04/2009)   SGPT: 18 (07/04/2009)   HDL:57 (07/04/2009), 74 (02/22/2009)  LDL:108 (07/04/2009), 61 (28/41/3244)  Chol:178 (07/04/2009), 145 (02/22/2009)  Trig:65 (07/04/2009), 49 (02/22/2009), PT WANTS TO D/C STATIN USE, CURRENTLY ON SIMVASTATIN 80MG , i RECOMMEND further discussion with her cardiologist, she has required high doses of simva in the past with good eating habits  Complete Medication List: 1)  Simvastatin 40 Mg Tabs (Simvastatin) .... One tab by mouth at bedtime 2)  Enalapril-hydrochlorothiazide 10-25 Mg Tabs (Enalapril-hydrochlorothiazide) .... One tab by mouth once daily 3)  Aspirin 325 Mg Tabs (Aspirin) .... Take 1 tablet by mouth every other day 4)  Betagan 0.5 % Soln (Levobunolol hcl) .... One drop in right eye two times a day 5)  Azopt 1 % Susp (Brinzolamide) .... One drop in right eye two times a day  Other Orders: T-TSH ([id number]) T-CBC w/Diff [phone]) Pelvic & Breast  Exam ( Medicare)  (G0101) Hemoccult Guaiac-1 spec.(in office) (44034)  Patient Instructions: 1)  F/U in 5 motnhs 3.5 weeks 2)  It is important that you exercise regularly at least 20 minutes 5 times a week. If you develop chest pain, have  severe difficulty breathing, or feel very tired , stop exercising immediately and seek medical attention. 3)  BMP prior to visit, ICD-9: 4)  Hepatic Panel prior to visit, ICD-9: 5)  Lipid Panel prior to visit, ICD-9:   fasting labs today 6)  TSH prior to visit, ICD-9: 7)  Vit D 8)  CBC and anemia panel, and anemia 9)  You will be referred to GI because of a change in your stool Prescriptions: ENALAPRIL-HYDROCHLOROTHIAZIDE 10-25 MG  TABS (ENALAPRIL-HYDROCHLOROTHIAZIDE) one tab by mouth once daily  #90 x 1   Entered by:   Everitt Amber LPN   Authorized by:   Syliva Overman MD   Signed by:   Everitt Amber LPN on 74/25/9563   Method used:   Faxed to ...       [facility] (mail-order)             , [place]         Ph: [phone]       Fax: [phone]   RxID:   [id number] SIMVASTATIN 40 MG  TABS (SIMVASTATIN) one tab by mouth at bedtime  #90 x 1   Entered by:   Everitt Amber LPN   Authorized by:   Syliva Overman MD   Signed by:   Everitt Amber LPN on 02/54/2706   Method used:   Faxed to ...       [facility] (mail-order)             , [place]         Ph: [phone]       Fax: [phone]   RxID:   [id number]       Laboratory Results    Stool - Occult Blood Hemmoccult #1: negative Date: 12/06/2009 Comments: [id number] 9R 8/11 118 1012

## 2010-07-18 NOTE — Assessment & Plan Note (Signed)
Summary: constipation,abd bloating/ss   Visit Type:  Initial Consult Referring Provider:  Dr. Syliva Overman Primary Care Provider:  Syliva Overman MD  CC:  bloating, indigestion, burning in throat, and alot of gas.  History of Present Illness: Amanda [name] presents today at the request of Dr. Lodema Hong secondary to bloating, gas, epigastric discomfort, and some indigestion. She reports constipation is r/t food choices such as starches. Reports abdominal bloating/gas approximately one month. +constipation X 2 mos. reports BM 1-2X per day usually but constipation for her is when she skips a day having a BM. takes herbal tea which helps provide relief from constipation. no melena or hematochezia. +epigastric soreness X 1 mos, exacerbated by food occasionally, +indigestion in throat X 1 mos. No hx of reflux. No dysphagia or odynophagia. has lost approximately 5 lbs in the past 2 mos. +loss of appetite. feels doesn't eat as much as used to.   [place] of abdomen: Dec 2011: wnl HIDA Dec 2011: 74% EF, no reproduction of symptoms  Current Medications (verified): 1)  Enalapril-Hydrochlorothiazide 10-25 Mg  Tabs (Enalapril-Hydrochlorothiazide) .... One Tab By Mouth Once Daily 2)  Aspirin 325 Mg Tabs (Aspirin) .... Take 1 Tablet By Mouth Every Other Day 3)  Betagan 0.5 % Soln (Levobunolol Hcl) .... One Drop in Right Eye Two Times A Day 4)  Azopt 1 % Susp (Brinzolamide) .... One Drop in Right Eye Two Times A Day 5)  Sertraline Hcl 25 Mg Tabs (Sertraline Hcl) .... Take 1 Tablet By Mouth Once A Day As Needed 6)  Simvastatin 40 Mg Tabs (Simvastatin) .... Once Daily  Allergies (verified): No Known Drug Allergies  Past History:  Past Medical History: Current Problems:  ANEMIA (ICD-285.9): has been seen by Dr. Mariel Sleet in past ARTHRITIS, KNEE (ICD-716.96) INSOMNIA (ICD-780.52) HYPERLIPIDEMIA (ICD-272.4) HYPERTENSION (ICD-401.9) Glaucoma   dec 2010 Dr Cyd Silence  Past Surgical History: Bilateral carpal  tunnel surgery (2004) Lumpectomy Left breast benign (1974) TAH and BSO (1984) Right knee aspiration (2006) Cataract extraction right eye dec 2010 Dr Jettie Pagan rotator cuff surgery right   2001/2002 Colonoscopy 2006 by Dr. Lovell Sheehan  Social History: Employed: caregiver, works part-time Single Three children Never Smoked Alcohol use-no, rare wine Drug use-no  Review of Systems General:  Complains of anorexia; denies fever and chills. Eyes:  Denies blurring, irritation, and discharge. ENT:  Denies sore throat, hoarseness, and difficulty swallowing. CV:  Denies chest pains and syncope. Resp:  Denies dyspnea at rest and wheezing. GI:  Complains of indigestion/heartburn, abdominal pain, gas/bloating, constipation, and change in bowel habits; denies difficulty swallowing, pain on swallowing, nausea, bloody BM's, and black BMs. GU:  Denies urinary burning and urinary frequency. MS:  Denies joint pain / LOM and joint swelling. Derm:  Denies rash, itching, and dry skin. Neuro:  Denies weakness and syncope. Psych:  Denies depression and anxiety. Endo:  Denies cold intolerance and heat intolerance. Heme:  Denies bruising and bleeding. Allergy:  Denies hives, rash, and sneezing.  Vital Signs:  Patient profile:   75 year old female Menstrual status:  hysterectomy Height:      62 inches Weight:      135 pounds BMI:     24.78 Temp:     98.5 degrees F oral Pulse rate:   60 / minute BP sitting:   128 / 82  (left arm) Cuff size:   regular  Vitals Entered By: Amanda Limes LPN (June 11, 2010 9:28 AM)  Physical Exam  General:  Well developed, well nourished, no acute distress. Head:  Normocephalic and atraumatic. Eyes:  without icterus, conjuctiva clear  Mouth:  No deformity or lesions, dentition normal. Neck:  Supple; no masses or thyromegaly. Lungs:  Clear throughout to auscultation. Heart:  Regular rate and rhythm; no murmurs, rubs,  or bruits. Abdomen:  normal bowel sounds, without  guarding, without rebound, no distesion, no tenderness, no masses, and no hepatomegally or splenomegaly.   Msk:  Symmetrical with no gross deformities. Normal posture. Pulses:  Normal pulses noted. Extremities:  No clubbing, cyanosis, edema or deformities noted. Neurologic:  Alert and  oriented x4;  grossly normal neurologically. Cervical Nodes:  No significant cervical adenopathy. Psych:  Alert and cooperative. Normal mood and affect.  Impression & Recommendations:  Problem # 1:  DYSPEPSIA (ICD-34.10)  75 year old with new onset dyspepsia X 1 mos, described as soreness, +indigestion. No prior hx of reflux. no dysphagia or odynophagia. No nausea. diff dx include PUD, upper GI malignancy can't be excluded. [place] negative for stones, HIDA scan normal, doubt biliary component  Prilosec 20 mg by mouth daily EGD with Dr. Jena Gauss in near future: the risks, benefits, alternatives have been discussed with pt, verbal consent obtained  Orders: Consultation Level III (95621)  Problem # 2:  CONSTIPATION (ICD-564.00)  no prior hx of chronic constipation. change in bowel habits noticed 2 mos ago. +abdominal gas/bloating. no pain. No melena or hematochezia. usually BM ever day, now every other day. Last TCS done in 2006 with Dr. Lovell Sheehan.  Retrieve reports from colonscopy, may need to do in conjuction with EGD due to change in bowel habits Miralax as needed constipation Fiber of choice daily Probiotic daily  Orders: Consultation Level III (30865)  Patient Instructions: 1)  Take Miralax daily as needed for constipation 2)  Take a probiotic daily (samples given) 3)  Fiber supplement of choice daily 4)  Prilosec 20 mg by mouth daily, 30 min before meals 5)  The medication list was reviewed and reconciled.  All changed / newly prescribed medications were explained.  A complete medication list was provided to the patient / caregiver.  Prescriptions: PRILOSEC 20 MG CPDR (OMEPRAZOLE) take 1 by mouth 30  min before meals  #30 x 5   Entered and Authorized by:   Gerrit Halls NP   Signed by:   Gerrit Halls NP on 06/11/2010   Method used:   Faxed to ...       [facility]  [address] [phone]* (retail)       [address]       [place], [place]  [address]       Ph: [phone] or [phone]       Fax: [phone]   RxID:   [id number]

## 2010-07-18 NOTE — Letter (Signed)
Summary: TCS/EGD ORDER  TCS/EGD ORDER   Imported By: Ave Filter 06/11/2010 10:28:05  _____________________________________________________________________  External Attachment:    Type:   Image     Comment:   External Document

## 2010-07-18 NOTE — Letter (Signed)
Summary: Out of Work Note  [facility]  [address]   [place], [place] [address]   Phone: [phone]  Fax: [phone]    06/11/2010  TO: Leodis Sias IT MAY CONCERN  RE: Amanda [name] [address] Linnell Fulling [birth year]       The above named individual will be under my care and will be out of work    FROM: 06/19/2010   THROUGH: 06/21/2010      If you have any further questions or need additional information, please call.     Sincerely,     [facility] R. Roetta Sessions, M.D.    Jonette Eva, M.D. Lorenza Burton, FNP-BC    Tana Coast, PA-C Phone: [phone]    Fax: [phone]

## 2010-07-18 NOTE — Assessment & Plan Note (Signed)
Summary: office visit   Vital Signs:  Patient profile:   75 year old female Menstrual status:  hysterectomy Height:      62 inches Weight:      136.25 pounds BMI:     25.01 O2 Sat:      96 % on Room air Pulse rate:   77 / minute Pulse rhythm:   regular Resp:     16 per minute BP sitting:   130 / 70  (left arm)  Vitals Entered By: Adella Hare LPN (May 23, 2010 7:59 AM)  Nutrition Counseling: Patient's BMI is greater than 25 and therefore counseled on weight management options.  O2 Flow:  Room air CC: follow-up visit Is Patient Diabetic? No Pain Assessment Patient in pain? no        CC:  follow-up visit.  History of Present Illness: Reports  that she has not been doing very well. She is concerned about weight loss, poor apetite, social withdrawal, feeling depressed. Never felt like this before. The main issue is that her son who has mental health problems is upsetting her terribly. She is havig fatigue and crying spells. Denies recent fever or chills. Denies sinus pressure, nasal congestion , ear pain or sore throat. Denies chest congestion, or cough productive of sputum. Denies chest pain, palpitations, PND, orthopnea or leg swelling. Reports RUQ pain and bloating with excessive belching , and  nausea,denies  vomitting, diarrhea or constipation. Denies change in bowel movements or bloody stool. Denies dysuria , frequency, incontinence or hesitancy. Denies  joint pain, swelling, or reduced mobility.Low back pain radiating to the legs continues to be a problem. Denies headaches, vertigo, seizures.  Denies  rash, lesions, or itch.     Current Medications (verified): 1)  Enalapril-Hydrochlorothiazide 10-25 Mg  Tabs (Enalapril-Hydrochlorothiazide) .... One Tab By Mouth Once Daily 2)  Aspirin 325 Mg Tabs (Aspirin) .... Take 1 Tablet By Mouth Every Other Day 3)  Betagan 0.5 % Soln (Levobunolol Hcl) .... One Drop in Right Eye Two Times A Day 4)  Azopt 1 % Susp  (Brinzolamide) .... One Drop in Right Eye Two Times A Day  Allergies (verified): No Known Drug Allergies  Review of Systems General:  Complains of fatigue. Eyes:  Complains of vision loss-both eyes; denies discharge, eye pain, and red eye; severe glaucoma. MS:  Complains of joint pain, low back pain, and mid back pain. Psych:  Complains of anxiety, depression, easily tearful, irritability, and mental problems; denies suicidal thoughts/plans, thoughts of violence, and unusual visions or sounds. Endo:  Denies cold intolerance, excessive hunger, excessive thirst, and excessive urination. Heme:  Denies abnormal bruising and bleeding. Allergy:  Denies hives or rash, itching eyes, and seasonal allergies.  Physical Exam  General:  Well-developed,well-nourished,in no acute distress; alert,appropriate and cooperative throughout examination.Tearful at times and depressed and anxious. HEENT: No facial asymmetry,  EOMI, No sinus tenderness, TM's Clear, oropharynx  pink and moist.   Chest: Clear to auscultation bilaterally.  CVS: S1, S2, No murmurs, No S3.   Abd: Soft, Nontender.  DG:LOVFIEPPI  ROM spine,adequate  hips, shoulders and knees.  Ext: No edema.   CNS: CN 2-12 intact, power tone and sensation normal throughout.   Skin: Intact, no visible lesions or rashes.  Psych: Good eye contact, normal affect.  Memory intact,    Impression & Recommendations:  Problem # 1:  DEPRESSION (ICD-311) Assessment Deteriorated  Her updated medication list for this problem includes:    Sertraline Hcl 25 Mg Tabs (Sertraline  hcl) ..... Take 1 tablet by mouth once a day  Orders: Psychology Referral (Psychology)  Discussed treatment options, including trial of antidpressant medication. Will refer to behavioral health. Follow-up call in in 24-48 hours and recheck in 2 weeks, sooner as needed. Patient agrees to call if any worsening of symptoms or thoughts of doing harm arise. Verified that the patient has no  suicidal ideation at this time.   Problem # 2:  DYSPEPSIA (ICD-536.8) Assessment: Comment Only  Orders: Radiology Referral (Radiology) Radiology Referral (Radiology)  Problem # 3:  SCIATICA (ICD-724.3) Assessment: Unchanged  The following medications were removed from the medication list:    Ibuprofen 800 Mg Tabs (Ibuprofen) .Marland Kitchen... Take 1 tablet by mouth three times a day Her updated medication list for this problem includes:    Aspirin 325 Mg Tabs (Aspirin) .Marland Kitchen... Take 1 tablet by mouth every other day  Problem # 4:  HYPERTENSION (ICD-401.9) Assessment: Unchanged  Her updated medication list for this problem includes:    Enalapril-hydrochlorothiazide 10-25 Mg Tabs (Enalapril-hydrochlorothiazide) ..... One tab by mouth once daily  Orders: T-Basic Metabolic Panel [id number])  BP today: 130/70 Prior BP: 126/80 (01/08/2010)  Labs Reviewed: K+: 3.7 (12/06/2009) Creat: : 1.00 (12/06/2009)   Chol: 164 (12/06/2009)   HDL: 68 (12/06/2009)   LDL: 80 (12/06/2009)   TG: 81 (12/06/2009)  Problem # 5:  HYPERLIPIDEMIA (ICD-272.4) Assessment: Comment Only  The following medications were removed from the medication list:    Simvastatin 40 Mg Tabs (Simvastatin) ..... One tab by mouth at bedtime Low fat dietdiscussed and encouraged  Orders: T-Lipid Profile [id number])  Labs Reviewed: SGOT: 19 (12/06/2009)   SGPT: 17 (12/06/2009)   HDL:68 (12/06/2009), 57 (07/04/2009)  LDL:80 (12/06/2009), 108 (30/86/5784)  Chol:164 (12/06/2009), 178 (07/04/2009)  Trig:81 (12/06/2009), 65 (07/04/2009)  Complete Medication List: 1)  Enalapril-hydrochlorothiazide 10-25 Mg Tabs (Enalapril-hydrochlorothiazide) .... One tab by mouth once daily 2)  Aspirin 325 Mg Tabs (Aspirin) .... Take 1 tablet by mouth every other day 3)  Betagan 0.5 % Soln (Levobunolol hcl) .... One drop in right eye two times a day 4)  Azopt 1 % Susp (Brinzolamide) .... One drop in right eye two times a day 5)  Sertraline Hcl 25 Mg  Tabs (Sertraline hcl) .... Take 1 tablet by mouth once a day  Other Orders: Gastroenterology Referral (GI)  Patient Instructions: 1)  Please schedule a follow-up appointment in 3 months. 2)  BMP prior to visit, ICD-9: 3)  Lipid Panel prior to visit, ICD-9: fasting today 4)  New med as discussed sent to your local pharmacy. 5)  You will be referred for gall bladder studies and to see the stomach  specialist. 6)  You will be referred to therapy in January Prescriptions: ENALAPRIL-HYDROCHLOROTHIAZIDE 10-25 MG  TABS (ENALAPRIL-HYDROCHLOROTHIAZIDE) one tab by mouth once daily  #90 x 1   Entered by:   Adella Hare LPN   Authorized by:   Syliva Overman MD   Signed by:   Adella Hare LPN on 69/62/9528   Method used:   Faxed to ...       [facility] (mail-order)             , [place]         Ph: [phone]       Fax: [phone]   RxID:   [id number] SERTRALINE HCL 25 MG TABS (SERTRALINE HCL) Take 1 tablet by mouth once a day  #30 x 1   Entered and Authorized by:   Syliva Overman MD  Signed by:   Syliva Overman MD on 05/23/2010   Method used:   Electronically to        [facility] [address] [phone]* (retail)       [address]       [place], [place]  [address]       Ph: [phone] or [phone]       Fax: [phone]   RxID:   [id number]    Orders Added: 1)  Est. Patient Level IV [38756] 2)  T-Basic Metabolic Panel [phone] 3)  T-Lipid Profile [[id number]] 4)  Radiology Referral [Radiology] 5)  Psychology Referral [Psychology] 6)  Gastroenterology Referral [GI] 7)  Radiology Referral [Radiology]

## 2010-07-18 NOTE — Miscellaneous (Signed)
  Clinical Lists Changes  Orders: Added new Service order of Medicare Electronic Prescription (G8553) - Signed 

## 2010-07-18 NOTE — Progress Notes (Signed)
Summary: please advise  Phone Note Call from Patient   Summary of Call: patient called in and wants to ask about colonoscopy and endoscopy and she states that Dr. Jena Gauss gave her a very high dosage of antibiotic she has an infection in her stomach.  She has to take 8 pills a day 4 in the morning and 4 in the afternoon. She states they didn't really go over anything with her and she is concerned.  Patient can be reached at [phone] after 2 pm today. Initial call taken by: Curtis Sites,  June 27, 2010 10:28 AM  Follow-up for Phone Call        tried to contact pt and left msg to call back. If she does, explain that many people have been exposed to a bacteria, unsure where it comes from that if untreated can cause stomach pain, ulcers and worse case scenario, not often , may lead to stomach cancer, she needs to take the antibiotics preescribed I know they are plentiful Follow-up by: Syliva Overman MD,  June 28, 2010 12:53 PM  Additional Follow-up for Phone Call Additional follow up Details #1::        called pt again, left message Additional Follow-up by: Everitt Amber LPN,  June 28, 2010 1:58 PM    Additional Follow-up for Phone Call Additional follow up Details #2::    patient called back and I gave her the information that Dr. Lodema Hong had put in phone note.  She understands and will take the medicine as she was told. Follow-up by: Curtis Sites,  June 28, 2010 2:36 PM

## 2010-07-18 NOTE — Progress Notes (Signed)
Summary: pre cert  Phone Note Call from Patient   Summary of Call: does this hida scan need pre certing the lady said yes Initial call taken by: Lind Guest,  May 29, 2010 9:01 AM  Follow-up for Phone Call        I called Medicare at [phone] and they said it didn't have to  be pre-cert, I also called [employer] and they said no. Follow-up by: Curtis Sites,  May 29, 2010 2:13 PM

## 2010-07-31 ENCOUNTER — Encounter: Payer: Self-pay | Admitting: Family Medicine

## 2010-07-31 ENCOUNTER — Telehealth: Payer: Self-pay | Admitting: Family Medicine

## 2010-08-07 NOTE — Progress Notes (Signed)
  Phone Note Other Incoming   Caller: dr Croke Summary of Call: pls fax Amanda [name]'s omerprazole 2omg to [facility] Initial call taken by: Syliva Overman MD,  July 31, 2010 7:09 PM    Prescriptions: OMEPRAZOLE 20 MG CPDR (OMEPRAZOLE) Take 1 capsule by mouth once a day  #90 x 1   Entered by:   Everitt Amber LPN   Authorized by:   Syliva Overman MD   Signed by:   Everitt Amber LPN on 16/03/9603   Method used:   Faxed to ...       [facility] [place] (mail-order)             , [place]         Ph: [phone]       Fax: [phone]   RxID:   [id number]

## 2010-08-07 NOTE — Miscellaneous (Signed)
  Clinical Lists Changes  Medications: Removed medication of PRILOSEC 20 MG CPDR (OMEPRAZOLE) take 1 by mouth 30 min before meals - Signed Added new medication of OMEPRAZOLE 20 MG CPDR (OMEPRAZOLE) Take 1 capsule by mouth once a day - Signed Rx of OMEPRAZOLE 20 MG CPDR (OMEPRAZOLE) Take 1 capsule by mouth once a day;  #90 x 1;  Signed;  Entered by: Syliva Overman MD;  Authorized by: Syliva Overman MD;  Method used: Printed then faxed to [facility] [place], , , [place]  , Ph: [phone], Fax: [phone]    Prescriptions: OMEPRAZOLE 20 MG CPDR (OMEPRAZOLE) Take 1 capsule by mouth once a day  #90 x 1   Entered and Authorized by:   Syliva Overman MD   Signed by:   Syliva Overman MD on 07/31/2010   Method used:   Printed then faxed to ...       [facility] [place] (mail-order)             , [place]         Ph: [phone]       Fax: [phone]   RxID:   [id number]

## 2010-08-09 ENCOUNTER — Encounter: Payer: Self-pay | Admitting: Family Medicine

## 2010-08-21 ENCOUNTER — Telehealth: Payer: Self-pay | Admitting: Family Medicine

## 2010-08-22 ENCOUNTER — Other Ambulatory Visit: Payer: Self-pay | Admitting: Family Medicine

## 2010-08-22 ENCOUNTER — Encounter (HOSPITAL_COMMUNITY): Payer: Self-pay

## 2010-08-22 ENCOUNTER — Encounter: Payer: Self-pay | Admitting: Family Medicine

## 2010-08-22 ENCOUNTER — Ambulatory Visit (HOSPITAL_COMMUNITY)
Admission: RE | Admit: 2010-08-22 | Discharge: 2010-08-22 | Disposition: A | Discharge status: Home / Self Care | Payer: Medicare Other | Source: Ambulatory Visit | Attending: Family Medicine | Admitting: Family Medicine

## 2010-08-22 ENCOUNTER — Ambulatory Visit (INDEPENDENT_AMBULATORY_CARE_PROVIDER_SITE_OTHER): Payer: Medicare Other | Admitting: Family Medicine

## 2010-08-22 DIAGNOSIS — I10 Essential (primary) hypertension: Secondary | ICD-10-CM

## 2010-08-22 DIAGNOSIS — R05 Cough: Secondary | ICD-10-CM

## 2010-08-22 DIAGNOSIS — K219 Gastro-esophageal reflux disease without esophagitis: Secondary | ICD-10-CM

## 2010-08-22 DIAGNOSIS — Z139 Encounter for screening, unspecified: Secondary | ICD-10-CM

## 2010-08-22 DIAGNOSIS — R059 Cough, unspecified: Secondary | ICD-10-CM | POA: Insufficient documentation

## 2010-08-22 DIAGNOSIS — E785 Hyperlipidemia, unspecified: Secondary | ICD-10-CM

## 2010-08-22 LAB — BASIC METABOLIC PANEL
BUN: 19 mg/dL (ref 6–23)
Calcium: 9.3 mg/dL (ref 8.4–10.5)
Creat: 1.11 mg/dL (ref 0.40–1.20)

## 2010-08-22 LAB — LIPID PANEL
Cholesterol: 174 mg/dL (ref 0–200)
LDL Cholesterol: 93 mg/dL (ref 0–99)
Triglycerides: 74 mg/dL (ref <150)

## 2010-08-22 LAB — HEPATIC FUNCTION PANEL
Albumin: 4.3 g/dL (ref 3.5–5.2)
Total Bilirubin: 0.5 mg/dL (ref 0.3–1.2)

## 2010-08-25 DIAGNOSIS — A048 Other specified bacterial intestinal infections: Secondary | ICD-10-CM | POA: Insufficient documentation

## 2010-08-26 LAB — CONVERTED CEMR LAB
AST: 20 units/L (ref 0–37)
Albumin: 4.3 g/dL (ref 3.5–5.2)
CO2: 28 meq/L (ref 19–32)
Chloride: 102 meq/L (ref 96–112)
HDL: 66 mg/dL (ref >39)
LDL Cholesterol: 93 mg/dL (ref 0–99)
Potassium: 4 meq/L (ref 3.5–5.3)
Sodium: 140 meq/L (ref 135–145)
Total Bilirubin: 0.5 mg/dL (ref 0.3–1.2)
Total CHOL/HDL Ratio: 2.6
VLDL: 15 mg/dL (ref 0–40)

## 2010-08-27 NOTE — Progress Notes (Signed)
Summary: lab work  Phone Note Call from Patient   Summary of Call: wants to know if she needs lab work tomorrow before her appt. [phone] Initial call taken by: Rudene Anda,  August 21, 2010 9:08 AM  Follow-up for Phone Call        fasting lipid, hepatic and chem 7 pls Follow-up by: Syliva Overman MD,  August 21, 2010 1:29 PM  Additional Follow-up for Phone Call Additional follow up Details #1::        called patient, left message  labs ordered Additional Follow-up by: Adella Hare LPN,  August 21, 2010 4:50 PM

## 2010-09-02 ENCOUNTER — Encounter: Payer: Self-pay | Admitting: Internal Medicine

## 2010-09-03 NOTE — Assessment & Plan Note (Signed)
Summary: follow up   Vital Signs:  Patient profile:   75 year old female Menstrual status:  hysterectomy Height:      62 inches Weight:      136.50 pounds BMI:     25.06 O2 Sat:      97 % Pulse rate:   87 / minute Pulse rhythm:   regular Resp:     16 per minute BP sitting:   130 / 82  (left arm) Cuff size:   regular  Vitals Entered By: Everitt Amber LPN (August 21, 1608 8:06 AM)  Nutrition Counseling: Patient's BMI is greater than 25 and therefore counseled on weight management options. CC: Follow up chronic problems   Primary Care Provider:  Syliva Overman MD  CC:  Follow up chronic problems.  History of Present Illness: Reports  that she is doing better than at her last visit. she is relieved to know she did not have significant pathology in her intestines, but voices the desireagain to have a voisit with the gI doc to explain h pylori diseaseand the findingsof her tests in the office. Denies recent fever or chills. Denies sinus pressure, nasal congestion , ear pain or sore throat. Denies chest congestion, or cough productive of sputum. Denies chest pain, palpitations, PND, orthopnea or leg swelling. Denies abdominal pain, nausea, vomitting, diarrhea or constipation. Denies change in bowel movements or bloody stool. Denies dysuria , frequency, incontinence or hesitancy. Denies  joint pain, swelling, or reduced mobility. Denies headaches, vertigo, seizures. reports improvement in her depression and anxiety, states once she knew what was going on with her stomach this significantly improved her wellbeing. The illness in her son is unchanged, chronic and she is able to handle this without medication. 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 Denies  rash, lesions, or itch.     Current Medications (verified): 1)  Enalapril-Hydrochlorothiazide 10-25 Mg  Tabs (Enalapril-Hydrochlorothiazide) .... One Tab By Mouth Once Daily 2)  Aspirin 325 Mg Tabs (Aspirin) .... Take 1  Tablet By Mouth Every Other Day 3)  Betagan 0.5 % Soln (Levobunolol Hcl) .... One Drop in Right Eye Two Times A Day 4)  Azopt 1 % Susp (Brinzolamide) .... One Drop in Right Eye Two Times A Day 5)  Simvastatin 40 Mg Tabs (Simvastatin) .... Once Daily 6)  Omeprazole 20 Mg Cpdr (Omeprazole) .... Take 1 Capsule By Mouth Once A Day  Allergies (verified): No Known Drug Allergies  Review of Systems      See HPI General:  Complains of sleep disorder. Eyes:  Complains of vision loss-both eyes; reports improvement i the pressures in her eyes. Resp:  Complains of cough; dry cough and tickle for months. Psych:  Complains of anxiety; denies depression, mental problems, panic attacks, sense of great danger, suicidal thoughts/plans, thoughts of violence, and unusual visions or sounds; mild chronic anxiety, no med needed. Endo:  Denies cold intolerance, excessive hunger, excessive thirst, excessive urination, and heat intolerance. Heme:  Denies abnormal bruising, bleeding, and enlarge lymph nodes. Allergy:  Denies hives or rash and itching eyes.  Physical Exam  General:  Well-developed,well-nourished,in no acute distress; alert,appropriate and cooperative throughout examination HEENT: No facial asymmetry,  EOMI, No sinus tenderness, TM's Clear, oropharynx  pink and moist.   Chest: Clear to auscultation bilaterally.  CVS: S1, S2, No murmurs, No S3.   Abd: Soft, Nontender.  MS: Adequate ROM spine, hips, shoulders and knees.  Ext: No edema.   CNS: CN 2-12 intact, power tone and  sensation normal throughout.   Skin: Intact, no visible lesions or rashes.  Psych: Good eye contact, normal affect.  Memory intact, not anxious or depressed appearing.    Impression & Recommendations:  Problem # 1:  COUGH (ICD-786.2) Assessment Comment Only  Orders: CXR- 2view (CXR)  Problem # 2:  GERD (ICD-530.81) Assessment: Improved  Her updated medication list for this problem includes:    Omeprazole 20 Mg Cpdr  (Omeprazole) .Marland Kitchen... Take 1 capsule by mouth once a day  Orders: Medicare Electronic Prescription [phone])  Problem # 3:  DEPRESSION (ICD-311)  The following medications were removed from the medication list:    Sertraline Hcl 25 Mg Tabs (Sertraline hcl) .Marland Kitchen... Take 1 tablet by mouth once a day as needed  Problem # 4:  HYPERTENSION (ICD-401.9) Assessment: Deteriorated  Her updated medication list for this problem includes:    Enalapril-hydrochlorothiazide 10-25 Mg Tabs (Enalapril-hydrochlorothiazide) ..... One tab by mouth once daily  Orders: CXR- 2view (CXR) T-Basic Metabolic Panel ([id number])  BP today: 130/82 Prior BP: 128/82 (06/11/2010)  Labs Reviewed: K+: 3.9 (05/23/2010) Creat: : 1.03 (05/23/2010)   Chol: 247 (05/23/2010)   HDL: 66 (05/23/2010)   LDL: 169 (05/23/2010)   TG: 61 (05/23/2010)  Problem # 5:  HYPERLIPIDEMIA (ICD-272.4) Assessment: Comment Only  Her updated medication list for this problem includes:    Simvastatin 40 Mg Tabs (Simvastatin) ..... Once daily Low fat dietdiscussed and encouraged  Orders: T-Hepatic Function [phone]) T-Lipid Profile [phone]) Medicare Electronic Prescription [phone])  Labs Reviewed: SGOT: 19 (12/06/2009)   SGPT: 17 (12/06/2009)   HDL:66 (05/23/2010), 68 (12/06/2009)  LDL:169 (05/23/2010), 80 (28/41/3244)  Chol:247 (05/23/2010), 164 (12/06/2009)  Trig:61 (05/23/2010), 81 (12/06/2009)  Problem # 6:  HELICOBACTER PYLORI GASTRITIS (ICD-041.86) Assessment: Improved diagnosed and treatedin 2012, pt had upper endo, and was prescribed abx by dr. Jena Gauss  Complete Medication List: 1)  Enalapril-hydrochlorothiazide 10-25 Mg Tabs (Enalapril-hydrochlorothiazide) .... One tab by mouth once daily 2)  Aspirin 325 Mg Tabs (Aspirin) .... Take 1 tablet by mouth every other day 3)  Betagan 0.5 % Soln (Levobunolol hcl) .... One drop in right eye two times a day 4)  Azopt 1 % Susp (Brinzolamide) .... One drop in right eye two times a  day 5)  Simvastatin 40 Mg Tabs (Simvastatin) .... Once daily 6)  Omeprazole 20 Mg Cpdr (Omeprazole) .... Take 1 capsule by mouth once a day  Other Orders: T-CBC w/Diff ([id number]) T-TSH [phone]) Radiology Referral (Radiology)  Patient Instructions: 1)  F/U in 4.5 months 2)  PLs let me know  about your stomach problem. 3)  No medication changes. 4)  cXR  today Prescriptions: OMEPRAZOLE 20 MG CPDR (OMEPRAZOLE) Take 1 capsule by mouth once a day  #90 x 1   Entered by:   Adella Hare LPN   Authorized by:   Syliva Overman MD   Signed by:   Adella Hare LPN on 56/38/7564   Method used:   Faxed to ...       [facility] [place] (mail-order)             , [place]         Ph: [phone]       Fax: [phone]   RxID:   [id number] SIMVASTATIN 40 MG TABS (SIMVASTATIN) once daily  #90 x 1   Entered by:   Adella Hare LPN   Authorized by:   Syliva Overman MD   Signed by:   Adella Hare LPN on 70/62/3762   Method used:   Arneta Cliche  to .Marland KitchenMarland Kitchen       [facility] [place] (mail-[facility])             , [place]         Ph: [phone]       Fax: [phone]   RxID:   [id number] ENALAPRIL-HYDROCHLOROTHIAZIDE 10-25 MG  TABS (ENALAPRIL-HYDROCHLOROTHIAZIDE) one tab by mouth once daily  #90 Tablet x 1   Entered by:   Adella Hare LPN   Authorized by:   Syliva Overman MD   Signed by:   Adella Hare LPN on 41/32/4401   Method used:   Faxed to ...       [facility] [place] (mail-order)             , [place]         Ph: [phone]       Fax: [phone]   RxID:   [id number]    Orders Added: 1)  Est. Patient Level IV [66063] 2)  CXR- 2view [CXR] 3)  T-Hepatic Function [[id number]] 4)  T-Lipid Profile [[id number]] 5)  T-Basic Metabolic Panel [[id number]] 6)  T-CBC w/Diff [[id number]] 7)  T-TSH [[id number]] 8)  Radiology Referral [Radiology] 9)  Medicare Electronic Prescription [phone]

## 2010-09-10 ENCOUNTER — Encounter: Payer: Self-pay | Admitting: Internal Medicine

## 2010-09-10 ENCOUNTER — Ambulatory Visit (INDEPENDENT_AMBULATORY_CARE_PROVIDER_SITE_OTHER): Payer: Medicare Other | Admitting: Internal Medicine

## 2010-09-10 VITALS — BP 148/76 | HR 60 | Temp 98.2°F | Ht 62.0 in | Wt 137.0 lb

## 2010-09-10 DIAGNOSIS — K5900 Constipation, unspecified: Secondary | ICD-10-CM

## 2010-09-10 DIAGNOSIS — K30 Functional dyspepsia: Secondary | ICD-10-CM

## 2010-09-10 DIAGNOSIS — K219 Gastro-esophageal reflux disease without esophagitis: Secondary | ICD-10-CM

## 2010-09-10 DIAGNOSIS — K5909 Other constipation: Secondary | ICD-10-CM

## 2010-09-10 DIAGNOSIS — K5904 Chronic idiopathic constipation: Secondary | ICD-10-CM

## 2010-09-10 DIAGNOSIS — R1013 Epigastric pain: Secondary | ICD-10-CM

## 2010-09-10 DIAGNOSIS — A048 Other specified bacterial intestinal infections: Secondary | ICD-10-CM

## 2010-09-10 NOTE — Assessment & Plan Note (Signed)
Well-controlled at this time with prudent diet. I've encouraged her to continue a balanced diet. May use MiraLax 17 g orally at bedtime when necessary any bouts of constipation. Plan for screening colonoscopy 2018.

## 2010-09-10 NOTE — Assessment & Plan Note (Signed)
GERD well controlled on omeprazole 20 mg orally daily. I feel benefits outweigh any theoretical risk of this approach.  I reviewed dietary modification. I have provided the patient with literature.  Would like to see this nice lady back in the office in one year.

## 2010-09-10 NOTE — Progress Notes (Signed)
Referring Provider: Syliva Overman, MD Primary Care Physician:  Syliva Overman, MD, MD  Chief Complaint  Patient presents with  . Follow-up    discuss results from procedure    HPI:  Amanda [name] is a 75 y.o. female here for follow up:  Patient has completed treatment for H. pylori infection. She has multiple questions about the infection. She was found to have active H. pylori infection on biopsies at time of the EGD back in January of this year. She states that since treatment she has begun to feel great. Only occasional reflux symptoms on omeprazole. Her dyspepsia symptoms have resolved. In addition, she tells me her constipation settled down or she's not having a taking MiraLax these days.  Past Medical History  Diagnosis Date  . Anemia     has been seen by Dr. Mariel Sleet   . Arthritis of knee   . Insomnia   . Hyperlipidemia   . Hypertension   . Glaucoma december 2010    Dr. Harlon Flor   . GERD (gastroesophageal reflux disease)   . Chronic constipation   . Helicobacter pylori infection     Past Surgical History  Procedure Date  . Bilateral carpal tunnel surgery 2004  . Total abdominal hysterectomy w/ bilateral salpingoophorectomy 1984  . Right knee aspiration 2006  . Cataract extraction right eye dec 2010    Dr. Jettie Pagan   . Rotator cuff surgery right 2001/2002  . Breast lumpectomy 1974    Right breast  . Colonoscopy Jan 2008    Dr. Lovell Sheehan  . Esophagogastroduodenoscopy January 2012    Dr. Jena Gauss HP gastritis    Current Outpatient Prescriptions  Medication Sig Dispense Refill  . aspirin 325 MG tablet Take 325 mg by mouth daily. Take one tablet by mouth every other day       . aspirin 81 MG tablet Take 81 mg by mouth daily.        . brinzolamide (AZOPT) 1 % ophthalmic suspension Place 1 drop into the right eye 3 (three) times daily. One drop in right eye two times a day       . enalapril-hydrochlorothiazide (VASERETIC) 10-25 MG per tablet Take 1 tablet by mouth daily.  One tablet by mouth once daily       . levobunolol (BETAGAN) 0.5 % ophthalmic solution 1 drop at bedtime. One drop in right eye two times a day       . omeprazole (PRILOSEC) 20 MG capsule Take 20 mg by mouth daily. Take 1 capsule by mouth once a day       . simvastatin (ZOCOR) 40 MG tablet Take 40 mg by mouth at bedtime. Once daily       . DISCONTD: sertraline (ZOLOFT) 25 MG tablet Take 25 mg by mouth daily. Take 1 tablet by mouth once a day         Allergies as of 09/10/2010  . (No Known Allergies)    Family History  Problem Relation Age of Onset  . Cancer Mother     bone   . Hypertension Father   . Stroke Father 37    age guestimate  . Heart attack Brother   . Heart disease Brother   . Hypertension Sister     History   Social History  . Marital Status: Single    Spouse Name: N/A    Number of Children: 3  . Years of Education: N/A   Occupational History  . caregiver , work part time  Social History Main Topics  . Smoking status: Never Smoker   . Smokeless tobacco: Never Used  . Alcohol Use: No     rare wine   . Drug Use: No  . Sexually Active: No   Other Topics Concern  . Not on file   Social History Narrative  . No narrative on file    Review of Systems: Gen: Denies any fever, chills, sweats, anorexia, fatigue, weakness, malaise, weight loss, and sleep disorder CV: Denies chest pain, angina, palpitations, syncope, orthopnea, PND, peripheral edema, and claudication. Resp: Denies dyspnea at rest, dyspnea with exercise, cough, sputum, wheezing, coughing up blood, and pleurisy. GI: Denies vomiting blood, jaundice, and fecal incontinence.   Denies dysphagia or odynophagia. Derm: Denies rash, itching, dry skin, hives, moles, warts, or unhealing ulcers.  Psych: Denies depression, anxiety, memory loss, suicidal ideation, hallucinations, paranoia, and confusion. Heme: Denies bruising, bleeding, and enlarged lymph nodes.  Physical Exam: BP 148/76  Pulse 60   Temp 98.2 F (36.8 C)  Ht 5\' 2"  (1.575 m)  Wt 137 lb (62.143 kg)  BMI 25.06 kg/m2 General:   Alert,  Well-developed, well-nourished, pleasant and cooperative in NAD Head:  Normocephalic and atraumatic. Eyes:  Sclera clear, no icterus.   Conjunctiva pink. Mouth:  No deformity or lesions, dentition normal. Neck:  Supple; no masses or thyromegaly. Heart:  Regular rate and rhythm; no murmurs, clicks, rubs,  or gallops. Abdomen:  Soft, nontender and nondistended. No masses, hepatosplenomegaly or hernias noted. Normal bowel sounds, without guarding, and without rebound.   Msk:  Symmetrical without gross deformities. Normal posture. Pulses:  Normal pulses noted. Extremities:  Without clubbing or edema. Neurologic:  Alert and  oriented x4;  grossly normal neurologically. Skin:  Intact without significant lesions or rashes. Cervical Nodes:  No significant cervical adenopathy. Psych:  Alert and cooperative. Normal mood and affect.

## 2010-09-10 NOTE — Patient Instructions (Signed)
gastroesophageal reflux disease

## 2010-09-10 NOTE — Progress Notes (Deleted)
  Subjective:    Patient ID: Amanda [name], female    DOB: 1935/07/01, 75 y.o.   MRN: [id number]  HPI    Review of Systems     Objective:   Physical Exam        Assessment & Plan:

## 2010-09-10 NOTE — Assessment & Plan Note (Signed)
Marked improvement in dyspeptic symptoms status post treatment of H. pylori infection with triple drug therapy. I reviewed the reasoning behind treating this infection. Likelihood of recurrence is very low. I provided her literature on what is known about H. pylori infection. No further intervention warranted at this time.

## 2010-09-23 LAB — CBC
HCT: 34.2 % — ABNORMAL LOW (ref 36.0–46.0)
Hemoglobin: 12.2 g/dL (ref 12.0–15.0)
Platelets: 222 10*3/uL (ref 150–400)
WBC: 5.9 10*3/uL (ref 4.0–10.5)

## 2010-09-23 LAB — DIFFERENTIAL
Eosinophils Relative: 1 % (ref 0–5)
Lymphocytes Relative: 37 % (ref 12–46)
Lymphs Abs: 2.2 10*3/uL (ref 0.7–4.0)

## 2010-10-28 ENCOUNTER — Ambulatory Visit (HOSPITAL_COMMUNITY): Payer: Medicare Other

## 2010-10-31 ENCOUNTER — Ambulatory Visit (HOSPITAL_COMMUNITY)
Admission: RE | Admit: 2010-10-31 | Discharge: 2010-10-31 | Disposition: A | Discharge status: Home / Self Care | Payer: Medicare Other | Source: Ambulatory Visit | Attending: Family Medicine | Admitting: Family Medicine

## 2010-10-31 DIAGNOSIS — Z139 Encounter for screening, unspecified: Secondary | ICD-10-CM

## 2010-10-31 DIAGNOSIS — Z1231 Encounter for screening mammogram for malignant neoplasm of breast: Secondary | ICD-10-CM | POA: Insufficient documentation

## 2010-11-01 NOTE — Op Note (Signed)
   NAME:  [name], Amanda                          ACCOUNT NO.:  [id number]   MEDICAL RECORD NO.:  [id number]                   PATIENT TYPE:  AMB   LOCATION:  DSC                                  FACILITY:  [facility]   PHYSICIAN:  Cindee Salt, M.D.                    DATE OF BIRTH:  07-19-1935   DATE OF PROCEDURE:  08/10/2002  DATE OF DISCHARGE:                                 OPERATIVE REPORT   PREOPERATIVE DIAGNOSIS:  Carpal tunnel syndrome, left hand.   POSTOPERATIVE DIAGNOSIS:  Carpal tunnel syndrome, left hand.   OPERATION:  Decompression of left median nerve   SURGEON:  Cindee Salt, M.D.   ASSISTANT:  Joaquin Courts, nurse specialist.   ANESTHESIA:  Forearm-based IV regional.   DATE OF OPERATION:  August 10, 2002   HISTORY:  The patient is a 75 year old female with a history of carpal  tunnel syndrome, EMG nerve conduction is positive, which has not responded  to conservative treatment.   DESCRIPTION OF PROCEDURE:  The patient was brought to the operating room  where a forearm-based IV regional anesthetic was carried out without  difficulty.  She was prepped and draped using Duraprep in the supine  position with the left arm free.  A longitudinal incision was made in the  palm,carried down through subcutaneous tissue.  Bleeders were  electrocauterized.  The palmar fascia was split.  The superficial palmar  arch was identified.  The flexor tendon to the ring and little finger  identified to the ulnar side of the medial nerve.  The carpal retinaculum  was incised with sharp resection.  A right-angle and Sewell retractor were  placed between skin and forearm fascia.  The fascia was released for  approximately 3 cm proximal to the wrist crease under direct vision.  The  canal was explored and no further lesions were identified.  Tenosynovial  tissue was thickened.  The wound was irrigated.  The skin was closed with  interrupted 5-0 nylon sutures.  A sterile compressive dressing and  splint  were applied.  The patient tolerated the procedure well and was taken to the  recovery room for observation in satisfactory condition.   DISPOSITION:  She is discharged home to return to the [facility] of  [place] in one week on Vicodin and Keflex.                                               Cindee Salt, M.D.    [name]  D:  08/10/2002  T:  08/10/2002  Job:  [id number]

## 2010-11-01 NOTE — Procedures (Signed)
   NAME:  SITLALI, KOERNER                          ACCOUNT NO.:  [id number]   MEDICAL RECORD NO.:  [id number]                   PATIENT TYPE:  OUT   LOCATION:  RAD                                  FACILITY:  [facility]   PHYSICIAN:  Maisie Fus C. Wall, M.D. [facility]            DATE OF BIRTH:  1935/11/22   DATE OF PROCEDURE:  DATE OF DISCHARGE:                                  ECHOCARDIOGRAM   INDICATION:  Nonspecified cardiovascular disease 429.2.   QUALITY OF STUDY:  The echocardiogram is technically adequate.   CONCLUSIONS:  1. Normal left and right atrial size.  2. Normal left ventricular chamber size and overall systolic function.  3. No evidence of left ventricular hypertrophy or segmental wall motion     abnormality.  4. Normal valvular function and morphology.  5. Normal right-sided structure.  6. Essentially normal 2-D echocardiogram.                                               Jesse Sans. Daleen Squibb, M.D. [facility]    TCW/MEDQ  D:  09/14/2002  T:  09/15/2002  Job:  [id number]   cc:   Milus Mallick. Lodema Hong, M.D.  [address]  [place], [place] [address]  Fax: [phone]   Vida Roller, M.D.  Fax: [phone]

## 2010-11-01 NOTE — Op Note (Signed)
   NAME:  Amanda [name], RYBACK                          ACCOUNT NO.:  [id number]   MEDICAL RECORD NO.:  [id number]                   PATIENT TYPE:  AMB   LOCATION:  DSC                                  FACILITY:  [facility]   PHYSICIAN:  Cindee Salt, M.D.                    DATE OF BIRTH:  1935-06-25   DATE OF PROCEDURE:  07/14/2002  DATE OF DISCHARGE:                                 OPERATIVE REPORT   PREOPERATIVE DIAGNOSIS:  Carpal tunnel syndrome, right hand.   POSTOPERATIVE DIAGNOSIS:  Carpal tunnel syndrome, right hand.   OPERATION:  Decompression, right median nerve.   SURGEON:  Nicki Reaper, M.D.   ASSISTANT:  Joaquin Courts, R.N.   ANESTHESIA:  Forearm-based IV regional.   HISTORY:  The patient is a 75 year old female with a history of carpal  tunnel syndrome.  EMG nerve conduction was positive, which has not responded  to conservative treatment.   DESCRIPTION OF PROCEDURE:  The patient is brought to the operating room.  A  forearm-based IV regional anesthetic was carried out without difficulty to  her right arm in supine position.  DuraPrep was used for the prep.  A  longitudinal incision was made in the palm, carried down through  subcutaneous tissue.  Bleeders were electrocauterized.  The palmar fascia  was split.  The superficial palmar arch identified.  The flexor tendon to  the ring and little finger identified to the ulnar side of the median nerve.  The carpal retinaculum was incised with sharp dissection.  A right angle and  Sewall retractor were placed between skin and forearm fascia.  The fascia  was released for approximately 3 cm proximal to the wrist crease under  direct vision.  The canal was explored.  No further lesions were identified.  The wound was irrigated.  The skin was closed with interrupted 5-0 nylon  sutures.  A sterile compressive dressing and splint was applied.  The  patient tolerated the procedure well and was taken to the recovery room for  observation  in satisfactory condition.  She is discharged to home to return  to the [facility] of [place] in one week on Vicodin and Keflex.                                               Cindee Salt, M.D.    [name]  D:  07/14/2002  T:  07/14/2002  Job:  [id number]

## 2010-11-01 NOTE — H&P (Signed)
NAME:  Amanda [name], Amanda [name]                ACCOUNT NO.:  [id number]   MEDICAL RECORD NO.:  [id number]          PATIENT TYPE:  AMB   LOCATION:                                 FACILITY:   PHYSICIAN:  Dalia Heading, M.D.  DATE OF BIRTH:  1936/05/20   DATE OF ADMISSION:  DATE OF DISCHARGE:  LH                                HISTORY & PHYSICAL   CHIEF COMPLAINT:  Need for screening colonoscopy.   HISTORY OF PRESENT ILLNESS:  The patient is a 75 year old black female who  is referred for endoscopic evaluation.  She needs a colonoscopy for  screening purposes.  No abdominal pain, weight loss, nausea, vomiting,  diarrhea, constipation, melena, hematochezia have been noted.  She has never  had a colonoscopy.  There is no family history of colon carcinoma.   PAST MEDICAL HISTORY:  Hypertension.   PAST SURGICAL HISTORY:  1.  Hysterectomy.  2.  Rotator cuff surgeries.   CURRENT MEDICATIONS:  1.  Zocor.  2.  Ambien.  3.  Enalapril.   ALLERGIES:  No known drug allergies.   REVIEW OF SYSTEMS:  Noncontributory.   PHYSICAL EXAMINATION:  GENERAL:  Patient is a well-developed, well-nourished  black female in no acute distress.  VITAL SIGNS:  She is afebrile and vital signs are stable.  LUNGS:  Clear to auscultation with equal breath sounds bilaterally.  HEART:  Regular rate and rhythm without S3, S4, or murmurs.  ABDOMEN:  Soft, nontender, nondistended.  No hepatosplenomegaly or masses  are noted.  RECTAL:  Deferred to the procedure.   IMPRESSION:  Need for screening colonoscopy.   PLAN:  The patient is scheduled for a colonoscopy on September 16, 2004.  The  risks and benefits of the procedure including bleeding and perforation were  fully explained to the patient, gave informed consent.      MAJ/MEDQ  D:  09/10/2004  T:  09/10/2004  Job:  [id number]   cc:   Milus Mallick. Lodema Hong, M.D.  [address]  [place], [place] [address]  Fax: [phone]   [facility]  Fax: [phone]

## 2011-01-07 ENCOUNTER — Encounter: Payer: Self-pay | Admitting: Family Medicine

## 2011-01-07 ENCOUNTER — Telehealth: Payer: Self-pay | Admitting: Family Medicine

## 2011-01-07 NOTE — Telephone Encounter (Signed)
Patient has lab order form, advised to take that to lab

## 2011-01-08 ENCOUNTER — Other Ambulatory Visit: Payer: Self-pay | Admitting: Family Medicine

## 2011-01-08 LAB — LIPID PANEL
Cholesterol: 153 mg/dL (ref 0–200)
HDL: 66 mg/dL (ref >39)
Triglycerides: 46 mg/dL (ref <150)
VLDL: 9 mg/dL (ref 0–40)

## 2011-01-08 LAB — HEPATIC FUNCTION PANEL
ALT: 12 U/L (ref 0–35)
Albumin: 4.1 g/dL (ref 3.5–5.2)
Alkaline Phosphatase: 60 U/L (ref 39–117)
Indirect Bilirubin: 0.3 mg/dL (ref 0.0–0.9)
Total Protein: 7.1 g/dL (ref 6.0–8.3)

## 2011-01-08 LAB — CBC WITH DIFFERENTIAL/PLATELET
Basophils Absolute: 0 10*3/uL (ref 0.0–0.1)
Basophils Relative: 0 % (ref 0–1)
HCT: 35.7 % — ABNORMAL LOW (ref 36.0–46.0)
Lymphocytes Relative: 23 % (ref 12–46)
MCHC: 31.9 g/dL (ref 30.0–36.0)
Monocytes Absolute: 0.5 10*3/uL (ref 0.1–1.0)
Neutro Abs: 4.1 10*3/uL (ref 1.7–7.7)
Platelets: 266 10*3/uL (ref 150–400)
RDW: 12.9 % (ref 11.5–15.5)
WBC: 6.2 10*3/uL (ref 4.0–10.5)

## 2011-01-08 LAB — BASIC METABOLIC PANEL
BUN: 19 mg/dL (ref 6–23)
Chloride: 105 mEq/L (ref 96–112)
Creat: 1.04 mg/dL (ref 0.50–1.10)
Potassium: 4.5 mEq/L (ref 3.5–5.3)

## 2011-01-09 ENCOUNTER — Ambulatory Visit (INDEPENDENT_AMBULATORY_CARE_PROVIDER_SITE_OTHER): Payer: Medicare Other | Admitting: Family Medicine

## 2011-01-09 ENCOUNTER — Encounter: Payer: Self-pay | Admitting: Family Medicine

## 2011-01-09 DIAGNOSIS — E785 Hyperlipidemia, unspecified: Secondary | ICD-10-CM

## 2011-01-09 DIAGNOSIS — R5381 Other malaise: Secondary | ICD-10-CM

## 2011-01-09 DIAGNOSIS — R5383 Other fatigue: Secondary | ICD-10-CM

## 2011-01-09 DIAGNOSIS — I10 Essential (primary) hypertension: Secondary | ICD-10-CM

## 2011-01-09 DIAGNOSIS — G4700 Insomnia, unspecified: Secondary | ICD-10-CM

## 2011-01-09 DIAGNOSIS — D649 Anemia, unspecified: Secondary | ICD-10-CM

## 2011-01-09 LAB — ANEMIA PANEL
Ferritin: 84 ng/mL (ref 10–291)
Folate: 15.7 ng/mL
TIBC: 321 ug/dL (ref 250–470)
UIBC: 287 ug/dL
Vitamin B-12: 1334 pg/mL — ABNORMAL HIGH (ref 211–911)

## 2011-01-09 MED ORDER — ENALAPRIL-HYDROCHLOROTHIAZIDE 10-25 MG PO TABS: 1.0000 | ORAL_TABLET | Freq: Every day | ORAL | Status: DC

## 2011-01-09 MED ORDER — SIMVASTATIN 40 MG PO TABS: 40.0000 mg | ORAL_TABLET | Freq: Every day | ORAL | Status: DC

## 2011-01-09 NOTE — Assessment & Plan Note (Signed)
Improved, just drinks milk with ovaltine

## 2011-01-09 NOTE — Progress Notes (Signed)
  Subjective:    Patient ID: Amanda [name], female    DOB: 15-Mar-1936, 75 y.o.   MRN: [id number]  HPI The PT is here for follow up and re-evaluation of chronic medical conditions, medication management and review of recent lab and radiology data.  Preventive health is updated, specifically  Cancer screening,  and Immunization.   Questions or concerns regarding consultations or procedures which the PT has had in the interim are  addressed. The PT denies any adverse reactions to current medications since the last visit.  There are no new concerns.  Having a lot of dental work recently which resulted in removal of her teeth to her distress. Pressure in her eyes are still elevated but very close f/u with opthalmology. Recent increase in med dose      Review of Systems Denies recent fever or chills. Denies sinus pressure, nasal congestion, ear pain or sore throat. Denies chest congestion, productive cough or wheezing. Denies chest pains, palpitations, paroxysmal nocturnal dyspnea, orthopnea and leg swelling Denies abdominal pain, nausea, vomiting,diarrhea or constipation.  Denies rectal bleeding or change in bowel movement. Denies dysuria, frequency, hesitancy or incontinence. Denies joint pain, swelling and limitation in mobility.c/o cracking at times in the right knee, no instability Denies headaches, seizure, numbness, or tingling. Denies depression, anxiety or insomnia. Denies skin break down or rash.        Objective:   Physical Exam    Patient alert and oriented and in no Cardiopulmonary distress.  HEENT: No facial asymmetry, EOMI, no sinus tenderness, TM's clear, Oropharynx pink and moist.  Neck supple no adenopathy.  Chest: Clear to auscultation bilaterally.  CVS: S1, S2 no murmurs, no S3.  ABD: Soft non tender. Bowel sounds normal.  Ext: No edema  MS: Adequate ROM spine, shoulders, hips and knees.  Skin: Intact, no ulcerations or rash noted.  Psych: Good eye  contact, normal affect. Memory intact not anxious or depressed appearing.  CNS: CN 2-12 intact, power, tone and sensation normal throughout.     Assessment & Plan:

## 2011-01-09 NOTE — Patient Instructions (Signed)
F/u in 6 months.  Fasting lipid, hepatic, chem 7.   Recommended, supplements, are one multivitamin once daily, calcium 1200mg  daily, vitamin D 800IU daily, asprin 81mg  daily  Labs are great, no med change

## 2011-01-09 NOTE — Assessment & Plan Note (Signed)
Controlled, no change in medication  

## 2011-01-09 NOTE — Assessment & Plan Note (Signed)
Chronic problem will add anemia panel

## 2011-03-13 LAB — COMPREHENSIVE METABOLIC PANEL
AST: 21
Albumin: 4.2
CO2: 30
Calcium: 10.3
Creatinine, Ser: 0.96
GFR calc Af Amer: >60
GFR calc non Af Amer: 57 — ABNORMAL LOW
Total Protein: 7.5

## 2011-03-13 LAB — IRON AND TIBC
Iron: 66
UIBC: 245

## 2011-03-13 LAB — DIFFERENTIAL
Eosinophils Relative: 0
Lymphocytes Relative: 23
Lymphs Abs: 1.1
Monocytes Relative: 9

## 2011-03-13 LAB — CBC
MCHC: 35
MCV: 91.5
Platelets: 247
RDW: 13.3

## 2011-03-13 LAB — FERRITIN: Ferritin: 133 (ref 10–291)

## 2011-03-19 ENCOUNTER — Ambulatory Visit (INDEPENDENT_AMBULATORY_CARE_PROVIDER_SITE_OTHER): Payer: Medicare Other

## 2011-03-19 DIAGNOSIS — Z23 Encounter for immunization: Secondary | ICD-10-CM

## 2011-05-05 ENCOUNTER — Telehealth: Payer: Self-pay

## 2011-05-05 NOTE — Telephone Encounter (Signed)
Patient picked up her dog wrong last week and now her right hand is swollen some ans still sore. Advised urgent care because there were no openings today. Scheduled to come in wed

## 2011-05-06 ENCOUNTER — Encounter: Payer: Self-pay | Admitting: Family Medicine

## 2011-05-06 ENCOUNTER — Other Ambulatory Visit: Payer: Self-pay | Admitting: Family Medicine

## 2011-05-07 ENCOUNTER — Ambulatory Visit (INDEPENDENT_AMBULATORY_CARE_PROVIDER_SITE_OTHER): Payer: Medicare Other | Admitting: Family Medicine

## 2011-05-07 ENCOUNTER — Encounter: Payer: Self-pay | Admitting: Family Medicine

## 2011-05-07 VITALS — BP 130/78 | HR 68 | Resp 16 | Ht 64.0 in | Wt 144.8 lb

## 2011-05-07 DIAGNOSIS — I10 Essential (primary) hypertension: Secondary | ICD-10-CM

## 2011-05-07 DIAGNOSIS — R5383 Other fatigue: Secondary | ICD-10-CM

## 2011-05-07 DIAGNOSIS — M949 Disorder of cartilage, unspecified: Secondary | ICD-10-CM

## 2011-05-07 DIAGNOSIS — M79641 Pain in right hand: Secondary | ICD-10-CM

## 2011-05-07 DIAGNOSIS — D649 Anemia, unspecified: Secondary | ICD-10-CM

## 2011-05-07 DIAGNOSIS — M899 Disorder of bone, unspecified: Secondary | ICD-10-CM

## 2011-05-07 DIAGNOSIS — Z13820 Encounter for screening for osteoporosis: Secondary | ICD-10-CM

## 2011-05-07 DIAGNOSIS — M79609 Pain in unspecified limb: Secondary | ICD-10-CM

## 2011-05-07 DIAGNOSIS — E785 Hyperlipidemia, unspecified: Secondary | ICD-10-CM

## 2011-05-07 DIAGNOSIS — R5381 Other malaise: Secondary | ICD-10-CM

## 2011-05-07 LAB — CBC WITH DIFFERENTIAL/PLATELET
Basophils Absolute: 0 10*3/uL (ref 0.0–0.1)
Basophils Relative: 1 % (ref 0–1)
Eosinophils Relative: 2 % (ref 0–5)
HCT: 36.2 % (ref 36.0–46.0)
MCH: 31.3 pg (ref 26.0–34.0)
MCHC: 34.3 g/dL (ref 30.0–36.0)
MCV: 91.4 fL (ref 78.0–100.0)
Monocytes Absolute: 0.5 10*3/uL (ref 0.1–1.0)
RDW: 13 % (ref 11.5–15.5)

## 2011-05-07 MED ORDER — ENALAPRIL-HYDROCHLOROTHIAZIDE 10-25 MG PO TABS: 1.0000 | ORAL_TABLET | Freq: Every day | ORAL | Status: DC

## 2011-05-07 MED ORDER — SIMVASTATIN 40 MG PO TABS: ORAL_TABLET | ORAL | Status: DC

## 2011-05-07 NOTE — Progress Notes (Signed)
  Subjective:    Patient ID: Amanda [name], female    DOB: Nov 07, 1935, 75 y.o.   MRN: [id number]  HPI Approx 10 days ago, pt was lifting her 15 to 20 pound dog that twisted, since then she has had swelling and pain of the right hand, wants evaluation for this since her motion is still slightly limited and painful. Has had carpal tunnel surgery in the past and wants to return to that surgeon. Has been less diligent with diet and exercise and has gained weight but intends to reverse the process   Review of Systems See HPI Denies recent fever or chills. Denies sinus pressure, nasal congestion, ear pain or sore throat. Denies chest congestion, productive cough or wheezing. Denies chest pains, palpitations and leg swelling Denies abdominal pain, nausea, vomiting,diarrhea or constipation.   Denies dysuria, frequency, hesitancy or incontinence. Denies headaches, seizures, numbness, or tingling. Denies depression, anxiety or insomnia. Denies skin break down or rash.        Objective:   Physical Exam Patient alert and oriented and in no cardiopulmonary distress.  HEENT: No facial asymmetry, EOMI, no sinus tenderness,  oropharynx pink and moist.  Neck supple no adenopathy.  Chest: Clear to auscultation bilaterally.  CVS: S1, S2 no murmurs, no S3.Peripheral pulses normal in both hands  ABD: Soft non tender. Bowel sounds normal.  Ext: No edema  MS: Adequate ROM spine, shoulders, hips and knees.Decreased ROM right hand with tenderness and slight swelling which appears to be bony  Skin: Intact, no ulcerations or rash noted.  Psych: Good eye contact, normal affect. Memory intact not anxious or depressed appearing.  CNS: CN 2-12 intact, power, tone and sensation normal throughout.        Assessment & Plan:

## 2011-05-07 NOTE — Patient Instructions (Addendum)
F/u in 4 month  Fasting lipid , hepatic , chem 7.in 4 months   Labs today cbc , iron vitd   You are referred to Dr Merlyn Lot and for bone density scan  I am happy that your eyes are better  tdaP today today  pls resume exercise and try to lose 5 pounds in the next 4 months  Use advil one twice daily  For  Next 1 week

## 2011-05-07 NOTE — Assessment & Plan Note (Signed)
Controlled, no change in medication  

## 2011-05-07 NOTE — Assessment & Plan Note (Signed)
10 day h/o right hand pain and swelling, will refer to dr Merlyn Lot

## 2011-06-19 ENCOUNTER — Ambulatory Visit: Payer: Medicare Other | Admitting: Family Medicine

## 2011-09-02 LAB — LIPID PANEL
LDL Cholesterol: 105 mg/dL — ABNORMAL HIGH (ref 0–99)
Triglycerides: 53 mg/dL (ref <150)

## 2011-09-02 LAB — BASIC METABOLIC PANEL
BUN: 20 mg/dL (ref 6–23)
CO2: 24 mEq/L (ref 19–32)
Chloride: 103 mEq/L (ref 96–112)
Creat: 0.95 mg/dL (ref 0.50–1.10)

## 2011-09-02 LAB — HEPATIC FUNCTION PANEL
Alkaline Phosphatase: 57 U/L (ref 39–117)
Indirect Bilirubin: 0.5 mg/dL (ref 0.0–0.9)
Total Bilirubin: 0.6 mg/dL (ref 0.3–1.2)
Total Protein: 7.3 g/dL (ref 6.0–8.3)

## 2011-09-03 ENCOUNTER — Ambulatory Visit (INDEPENDENT_AMBULATORY_CARE_PROVIDER_SITE_OTHER): Payer: Medicare Other | Admitting: Family Medicine

## 2011-09-03 ENCOUNTER — Other Ambulatory Visit: Payer: Self-pay | Admitting: Family Medicine

## 2011-09-03 ENCOUNTER — Encounter: Payer: Self-pay | Admitting: Family Medicine

## 2011-09-03 VITALS — BP 134/74 | HR 64 | Resp 18 | Ht 64.0 in | Wt 141.1 lb

## 2011-09-03 DIAGNOSIS — M79609 Pain in unspecified limb: Secondary | ICD-10-CM

## 2011-09-03 DIAGNOSIS — E785 Hyperlipidemia, unspecified: Secondary | ICD-10-CM

## 2011-09-03 DIAGNOSIS — R5381 Other malaise: Secondary | ICD-10-CM

## 2011-09-03 DIAGNOSIS — M949 Disorder of cartilage, unspecified: Secondary | ICD-10-CM

## 2011-09-03 DIAGNOSIS — Z139 Encounter for screening, unspecified: Secondary | ICD-10-CM

## 2011-09-03 DIAGNOSIS — M899 Disorder of bone, unspecified: Secondary | ICD-10-CM

## 2011-09-03 DIAGNOSIS — M79641 Pain in right hand: Secondary | ICD-10-CM

## 2011-09-03 DIAGNOSIS — I10 Essential (primary) hypertension: Secondary | ICD-10-CM

## 2011-09-03 NOTE — Patient Instructions (Signed)
F/u in early October.  Call if you need me before  We will schedule your mammogram early morning per your request May 18 or after  CBC, fasting lipid and CMP and Vit D and TSH in October before visit  Keep appt for dexa , it is due  Happy that your hand is doing better and so are your eyes.  It is important that you exercise regularly at least 30 minutes 5 times a week. If you develop chest pain, have severe difficulty breathing, or feel very tired, stop exercising immediately and seek medical attention

## 2011-09-03 NOTE — Progress Notes (Signed)
  Subjective:    Patient ID: Amanda [name], female    DOB: [birth year], 76 y.o.   MRN: [id number]  HPI The PT is here for follow up and re-evaluation of chronic medical conditions, medication management and review of any available recent lab and radiology data.  Preventive health is updated, specifically  Cancer screening and Immunization.   Questions or concerns regarding consultations or procedures which the PT has had in the interim are  Addressed.Pt continues to have her glaucoma followed by opthalmology and reports improvement, also saw the hand specialist and is satisfied with the treatment, which was non surgical The PT denies any adverse reactions to current medications since the last visit.  There are no new concerns.  There are no specific complaints       Review of Systems See HPI Denies recent fever or chills. Denies sinus pressure, nasal congestion, ear pain or sore throat. Denies chest congestion, productive cough or wheezing. Denies chest pains, palpitations and leg swelling Denies abdominal pain, nausea, vomiting,diarrhea or constipation.   Denies dysuria, frequency, hesitancy or incontinence. Denies joint pain, swelling and limitation in mobility. Denies headaches, seizures, numbness, or tingling. Denies depression, anxiety or insomnia. Denies skin break down or rash.        Objective:   Physical Exam  Patient alert and oriented and in no cardiopulmonary distress.  HEENT: No facial asymmetry, EOMI, no sinus tenderness,  oropharynx pink and moist.  Neck supple no adenopathy.  Chest: Clear to auscultation bilaterally.  CVS: S1, S2 no murmurs, no S3.  ABD: Soft non tender. Bowel sounds normal.  Ext: No edema  MS: Adequate ROM spine, shoulders, hips and knees.  Skin: Intact, no ulcerations or rash noted.  Psych: Good eye contact, normal affect. Memory intact not anxious or depressed appearing.  CNS: CN 2-12 intact, power, tone and sensation normal  throughout.       Assessment & Plan:

## 2011-09-04 ENCOUNTER — Other Ambulatory Visit: Payer: Self-pay | Admitting: Family Medicine

## 2011-09-04 MED ORDER — ENALAPRIL-HYDROCHLOROTHIAZIDE 10-25 MG PO TABS: 1.0000 | ORAL_TABLET | Freq: Every day | ORAL | Status: DC

## 2011-09-06 NOTE — Assessment & Plan Note (Signed)
Improved

## 2011-09-06 NOTE — Assessment & Plan Note (Signed)
Controlled, no change in medication  

## 2011-09-06 NOTE — Assessment & Plan Note (Signed)
lDL elevated, pt has also gained a few pounds, intends to work on this

## 2011-09-08 ENCOUNTER — Ambulatory Visit (HOSPITAL_COMMUNITY)
Admission: RE | Admit: 2011-09-08 | Discharge: 2011-09-08 | Disposition: A | Discharge status: Home / Self Care | Payer: Medicare Other | Source: Ambulatory Visit | Attending: Family Medicine | Admitting: Family Medicine

## 2011-09-08 DIAGNOSIS — Z90710 Acquired absence of both cervix and uterus: Secondary | ICD-10-CM | POA: Insufficient documentation

## 2011-09-08 DIAGNOSIS — Z780 Asymptomatic menopausal state: Secondary | ICD-10-CM | POA: Insufficient documentation

## 2011-09-08 DIAGNOSIS — Z13820 Encounter for screening for osteoporosis: Secondary | ICD-10-CM

## 2011-11-04 ENCOUNTER — Ambulatory Visit (HOSPITAL_COMMUNITY)
Admission: RE | Admit: 2011-11-04 | Discharge: 2011-11-04 | Disposition: A | Discharge status: Home / Self Care | Payer: Medicare Other | Source: Ambulatory Visit | Attending: Family Medicine | Admitting: Family Medicine

## 2011-11-04 DIAGNOSIS — Z139 Encounter for screening, unspecified: Secondary | ICD-10-CM

## 2011-11-04 DIAGNOSIS — Z1231 Encounter for screening mammogram for malignant neoplasm of breast: Secondary | ICD-10-CM | POA: Insufficient documentation

## 2012-02-24 ENCOUNTER — Telehealth: Payer: Self-pay | Admitting: Family Medicine

## 2012-02-25 MED ORDER — SIMVASTATIN 40 MG PO TABS: ORAL_TABLET | ORAL | Status: DC

## 2012-02-25 NOTE — Telephone Encounter (Signed)
Sent in

## 2012-03-10 ENCOUNTER — Ambulatory Visit (INDEPENDENT_AMBULATORY_CARE_PROVIDER_SITE_OTHER): Payer: Medicare Other

## 2012-03-10 DIAGNOSIS — Z23 Encounter for immunization: Secondary | ICD-10-CM

## 2012-03-25 ENCOUNTER — Other Ambulatory Visit: Payer: Self-pay | Admitting: Family Medicine

## 2012-03-26 LAB — TSH: TSH: 1.487 u[IU]/mL (ref 0.350–4.500)

## 2012-03-26 LAB — LIPID PANEL
HDL: 62 mg/dL (ref >39)
Total CHOL/HDL Ratio: 2.9 Ratio
Triglycerides: 44 mg/dL (ref <150)

## 2012-03-26 LAB — COMPREHENSIVE METABOLIC PANEL
AST: 20 U/L (ref 0–37)
Albumin: 4.3 g/dL (ref 3.5–5.2)
BUN: 24 mg/dL — ABNORMAL HIGH (ref 6–23)
Calcium: 9.7 mg/dL (ref 8.4–10.5)
Chloride: 105 mEq/L (ref 96–112)
Creat: 1.06 mg/dL (ref 0.50–1.10)
Glucose, Bld: 79 mg/dL (ref 70–99)

## 2012-03-26 LAB — CBC
HCT: 34.3 % — ABNORMAL LOW (ref 36.0–46.0)
Hemoglobin: 11.6 g/dL — ABNORMAL LOW (ref 12.0–15.0)
MCHC: 33.8 g/dL (ref 30.0–36.0)
MCV: 88.9 fL (ref 78.0–100.0)
RDW: 13.9 % (ref 11.5–15.5)
WBC: 4.4 10*3/uL (ref 4.0–10.5)

## 2012-03-30 ENCOUNTER — Encounter: Payer: Self-pay | Admitting: Family Medicine

## 2012-03-30 ENCOUNTER — Ambulatory Visit (INDEPENDENT_AMBULATORY_CARE_PROVIDER_SITE_OTHER): Payer: Medicare Other | Admitting: Family Medicine

## 2012-03-30 ENCOUNTER — Ambulatory Visit: Payer: Medicare Other | Admitting: Family Medicine

## 2012-03-30 VITALS — BP 128/70 | HR 60 | Resp 18 | Ht 64.0 in | Wt 138.1 lb

## 2012-03-30 DIAGNOSIS — E785 Hyperlipidemia, unspecified: Secondary | ICD-10-CM

## 2012-03-30 DIAGNOSIS — Z1211 Encounter for screening for malignant neoplasm of colon: Secondary | ICD-10-CM

## 2012-03-30 DIAGNOSIS — R0989 Other specified symptoms and signs involving the circulatory and respiratory systems: Secondary | ICD-10-CM

## 2012-03-30 DIAGNOSIS — I447 Left bundle-branch block, unspecified: Secondary | ICD-10-CM | POA: Insufficient documentation

## 2012-03-30 DIAGNOSIS — I10 Essential (primary) hypertension: Secondary | ICD-10-CM

## 2012-03-30 DIAGNOSIS — D649 Anemia, unspecified: Secondary | ICD-10-CM

## 2012-03-30 LAB — IRON AND TIBC
%SAT: 18 % — ABNORMAL LOW (ref 20–55)
Iron: 58 ug/dL (ref 42–145)
TIBC: 330 ug/dL (ref 250–470)
UIBC: 272 ug/dL (ref 125–400)

## 2012-03-30 LAB — FERRITIN: Ferritin: 70 ng/mL (ref 10–291)

## 2012-03-30 MED ORDER — ENALAPRIL-HYDROCHLOROTHIAZIDE 10-25 MG PO TABS: 1.0000 | ORAL_TABLET | Freq: Every day | ORAL | Status: DC

## 2012-03-30 NOTE — Assessment & Plan Note (Signed)
Persistent intermittent right knee pain, regular physical activity and OTC analgesia if needed

## 2012-03-30 NOTE — Assessment & Plan Note (Signed)
Controlled, no change in medication DASH diet and commitment to daily physical activity for a minimum of 30 minutes discussed and encouraged, as a part of hypertension management. The importance of attaining a healthy weight is also discussed.  

## 2012-03-30 NOTE — Assessment & Plan Note (Signed)
Followed by cardiology annually, no concerning symptoms currently. Will refer for annual check

## 2012-03-30 NOTE — Patient Instructions (Addendum)
Annual wellness in 5.month and 3 weeks, call if you need me before  You are doing extremely well, keep it up.  Commit to daily exercise for 1 hour total  Commit to taking cholesterol medication as prescribed, your bad cholesterol is slightly elevated.  You are referred for a test to evaluate blood flow in your eck arteries also to follow up with Dr. Allyson Sabal. Rectal today  All the best with cataract surgery   Fasting lipid and cmp , cbc in 5.months and 3 weeks

## 2012-03-30 NOTE — Progress Notes (Signed)
  Subjective:    Patient ID: Amanda [name], female    DOB: 1936/02/28, 76 y.o.   MRN: [id number]  HPI The PT is here for follow up and re-evaluation of chronic medical conditions, medication management and review of any available recent lab and radiology data.  Preventive health is updated, specifically  Cancer screening and Immunization.   Voices concern about CAD in her family, sees cardiology annually, appt due at this time The PT denies any adverse reactions to current medications since the last visit.  C/o occasional right shoulder pain with activity, had surgery in the remote past.Also experiences intermittent righht knee pain, no instability. Wants o recommit to regular physical activity, but has re invested in healthy diet with steady weight loss. May "forget " zocor twice weekly, she is not happy with LDL value and will change this  Has upcoming left cataract surgery      Review of Systems See HPI Denies recent fever or chills. Denies sinus pressure, nasal congestion, ear pain or sore throat. Denies chest congestion, productive cough or wheezing. Denies chest pains, palpitations and leg swelling Denies abdominal pain, nausea, vomiting,diarrhea or constipation.   Denies dysuria, frequency, hesitancy or incontinence.  Denies headaches, seizures, numbness, or tingling. Denies depression, anxiety or insomnia. Denies skin break down or rash.        Objective:   Physical Exam  Patient alert and oriented and in no cardiopulmonary distress.  HEENT: No facial asymmetry, EOMI, no sinus tenderness,  oropharynx pink and moist.  Neck supple no adenopathy.  Chest: Clear to auscultation bilaterally.  CVS: S1, S2 no murmurs, no S3.  ABD: Soft non tender. Bowel sounds normal. Rectal: no mass, heme negative stool Ext: No edema  MS: Adequate ROM spine, shoulders, hips and knees.  Skin: Intact, no ulcerations or rash noted.  Psych: Good eye contact, normal affect. Memory  intact not anxious or depressed appearing.  CNS: CN 2-12 intact, power, tone and sensation normal throughout.       Assessment & Plan:

## 2012-03-30 NOTE — Assessment & Plan Note (Signed)
Fall in hB need to check iron status

## 2012-03-30 NOTE — Assessment & Plan Note (Signed)
Hyperlipidemia:Low fat diet discussed and encouraged.  Pt reminded of the need to be compliant with medication daily.  No med change

## 2012-03-30 NOTE — Assessment & Plan Note (Signed)
H/o CVD in family, will check carotid doppler

## 2012-04-05 ENCOUNTER — Ambulatory Visit (HOSPITAL_COMMUNITY)
Admission: RE | Admit: 2012-04-05 | Discharge: 2012-04-05 | Disposition: A | Discharge status: Home / Self Care | Payer: Medicare Other | Source: Ambulatory Visit | Attending: Family Medicine | Admitting: Family Medicine

## 2012-04-05 DIAGNOSIS — R0989 Other specified symptoms and signs involving the circulatory and respiratory systems: Secondary | ICD-10-CM

## 2012-04-05 DIAGNOSIS — I10 Essential (primary) hypertension: Secondary | ICD-10-CM | POA: Insufficient documentation

## 2012-04-12 ENCOUNTER — Other Ambulatory Visit: Payer: Self-pay | Admitting: Family Medicine

## 2012-04-12 DIAGNOSIS — E041 Nontoxic single thyroid nodule: Secondary | ICD-10-CM

## 2012-04-14 ENCOUNTER — Ambulatory Visit (HOSPITAL_COMMUNITY)
Admission: RE | Admit: 2012-04-14 | Discharge: 2012-04-14 | Disposition: A | Discharge status: Home / Self Care | Payer: Medicare Other | Source: Ambulatory Visit | Attending: Family Medicine | Admitting: Family Medicine

## 2012-04-14 DIAGNOSIS — E041 Nontoxic single thyroid nodule: Secondary | ICD-10-CM | POA: Insufficient documentation

## 2012-04-14 DIAGNOSIS — E079 Disorder of thyroid, unspecified: Secondary | ICD-10-CM | POA: Insufficient documentation

## 2012-04-15 ENCOUNTER — Ambulatory Visit (INDEPENDENT_AMBULATORY_CARE_PROVIDER_SITE_OTHER): Payer: Medicare Other | Admitting: Otolaryngology

## 2012-04-15 ENCOUNTER — Other Ambulatory Visit: Payer: Self-pay | Admitting: Family Medicine

## 2012-04-15 DIAGNOSIS — E041 Nontoxic single thyroid nodule: Secondary | ICD-10-CM

## 2012-04-15 DIAGNOSIS — D449 Neoplasm of uncertain behavior of unspecified endocrine gland: Secondary | ICD-10-CM

## 2012-04-16 ENCOUNTER — Other Ambulatory Visit (INDEPENDENT_AMBULATORY_CARE_PROVIDER_SITE_OTHER): Payer: Self-pay | Admitting: Otolaryngology

## 2012-04-16 DIAGNOSIS — E079 Disorder of thyroid, unspecified: Secondary | ICD-10-CM

## 2012-04-22 ENCOUNTER — Other Ambulatory Visit (HOSPITAL_COMMUNITY): Payer: Self-pay | Admitting: Otolaryngology

## 2012-04-22 ENCOUNTER — Ambulatory Visit (HOSPITAL_COMMUNITY)
Admission: RE | Admit: 2012-04-22 | Discharge: 2012-04-22 | Disposition: A | Discharge status: Home / Self Care | Payer: Medicare Other | Source: Ambulatory Visit | Attending: Otolaryngology | Admitting: Otolaryngology

## 2012-04-22 DIAGNOSIS — E079 Disorder of thyroid, unspecified: Secondary | ICD-10-CM

## 2012-04-22 NOTE — Progress Notes (Signed)
Lidocaine 2%         1mL injected                 Right thyroid biopsy performed 

## 2012-06-17 ENCOUNTER — Encounter: Payer: Self-pay | Admitting: Family Medicine

## 2012-06-17 ENCOUNTER — Ambulatory Visit (INDEPENDENT_AMBULATORY_CARE_PROVIDER_SITE_OTHER): Payer: Medicare Other | Admitting: Family Medicine

## 2012-06-17 VITALS — BP 124/80 | HR 66 | Temp 98.2°F | Resp 16 | Ht 64.0 in | Wt 141.0 lb

## 2012-06-17 DIAGNOSIS — R05 Cough: Secondary | ICD-10-CM

## 2012-06-17 DIAGNOSIS — I10 Essential (primary) hypertension: Secondary | ICD-10-CM

## 2012-06-17 DIAGNOSIS — R058 Other specified cough: Secondary | ICD-10-CM

## 2012-06-17 DIAGNOSIS — J309 Allergic rhinitis, unspecified: Secondary | ICD-10-CM

## 2012-06-17 MED ORDER — PREDNISONE (PAK) 5 MG PO TABS: ORAL_TABLET | ORAL | Status: DC

## 2012-06-17 MED ORDER — PROMETHAZINE-DM 6.25-15 MG/5ML PO SYRP: ORAL_SOLUTION | ORAL | Status: AC

## 2012-06-17 MED ORDER — PREDNISONE 5 MG PO TABS: ORAL_TABLET | ORAL | Status: AC

## 2012-06-17 MED ORDER — METHYLPREDNISOLONE ACETATE 80 MG/ML IJ SUSP
80.0000 mg | Freq: Once | INTRAMUSCULAR | Status: AC
  Administered 2012-06-17: 80 mg via INTRAMUSCULAR

## 2012-06-17 NOTE — Assessment & Plan Note (Signed)
Uncontrolled, limit cold exposure, short sharp steroid course and depo medrol Sudafed for the next 5 days

## 2012-06-17 NOTE — Assessment & Plan Note (Signed)
Controlled, no change in medication  

## 2012-06-17 NOTE — Patient Instructions (Addendum)
F/u as before. You are being treated for allergic based cough and runny nose from excessive cold exposure.  Medication is sent to your local pharmacy  The cough suppressant has a sleepy side effect, so bedtime use if needed, only  Depo medrol 80mg  im in the office. Please take prednisone 1 twice daily for 5 days. Take 1 sudafed tablet one daily for the next 3 to 5 days to reduce drainage.  Please call in 2 to 4 days if no better or are worsening.  If you develop chills, fever, yellow sputum within the next 1 to 2 weeks you will need antibiotics which I will prescribe for you, at this time you do not

## 2012-06-17 NOTE — Assessment & Plan Note (Signed)
Uncontrolled. Depo medrol, prednisone and cough suppressant as needed, pt to call if symptoms worsen

## 2012-06-17 NOTE — Progress Notes (Signed)
  Subjective:    Patient ID: Amanda [name], female    DOB: [birth year], 77 y.o.   MRN: [id number]  HPI 6 days ago, pt over exposed herself to the outdoors, looked sunny, but was cold and she was under dressed. Since then she has had head congestion, nasal congestion, watery eyes , sneezing and clear runny nose. Cough, bit non productive, no fever or chills. Prior to this she had been doing well   Review of Systems See HPI  Denies chest pains, palpitations and leg swelling Denies abdominal pain, nausea, vomiting,diarrhea or constipation.   Denies dysuria, frequency, hesitancy or incontinence. Denies joint pain, swelling and limitation in mobility. Denies headaches, seizures, numbness, or tingling. Denies depression, anxiety or insomnia. Denies skin break down or rash.        Objective:   Physical Exam  Patient alert and oriented and in no cardiopulmonary distress.Nasal speech due to sinus congestion  HEENT: No facial asymmetry, EOMI, no sinus tenderness,  oropharynx pink and moist.  Neck supple no adenopathy.Nasal mucosa erythematous and edematous  Chest: Clear to auscultation bilaterally.  CVS: S1, S2 no murmurs, no S3.  ABD: Soft non tender. Bowel sounds normal.  Ext: No edema  MCNS: CN 2-12 intact, power,  normal throughout.       Assessment & Plan:

## 2012-09-02 ENCOUNTER — Other Ambulatory Visit: Payer: Self-pay | Admitting: Family Medicine

## 2012-09-03 LAB — LIPID PANEL
HDL: 68 mg/dL (ref >39)
LDL Cholesterol: 95 mg/dL (ref 0–99)

## 2012-09-03 LAB — CBC
MCHC: 33.2 g/dL (ref 30.0–36.0)
Platelets: 264 10*3/uL (ref 150–400)
RDW: 14.2 % (ref 11.5–15.5)
WBC: 4 10*3/uL (ref 4.0–10.5)

## 2012-09-03 LAB — COMPREHENSIVE METABOLIC PANEL
ALT: 15 U/L (ref 0–35)
AST: 18 U/L (ref 0–37)
Albumin: 4.4 g/dL (ref 3.5–5.2)
Alkaline Phosphatase: 50 U/L (ref 39–117)
Calcium: 10.2 mg/dL (ref 8.4–10.5)
Chloride: 104 mEq/L (ref 96–112)
Potassium: 4.6 mEq/L (ref 3.5–5.3)
Sodium: 140 mEq/L (ref 135–145)

## 2012-09-13 ENCOUNTER — Ambulatory Visit: Payer: Medicare Other | Admitting: Family Medicine

## 2012-09-14 ENCOUNTER — Ambulatory Visit (INDEPENDENT_AMBULATORY_CARE_PROVIDER_SITE_OTHER): Payer: Medicare Other | Admitting: Family Medicine

## 2012-09-14 ENCOUNTER — Encounter: Payer: Self-pay | Admitting: Family Medicine

## 2012-09-14 VITALS — BP 110/62 | HR 73 | Resp 16 | Ht 64.0 in | Wt 140.0 lb

## 2012-09-14 DIAGNOSIS — Z0000 Encounter for general adult medical examination without abnormal findings: Secondary | ICD-10-CM | POA: Insufficient documentation

## 2012-09-14 DIAGNOSIS — E785 Hyperlipidemia, unspecified: Secondary | ICD-10-CM

## 2012-09-14 DIAGNOSIS — I10 Essential (primary) hypertension: Secondary | ICD-10-CM

## 2012-09-14 DIAGNOSIS — J309 Allergic rhinitis, unspecified: Secondary | ICD-10-CM

## 2012-09-14 MED ORDER — LORATADINE 10 MG PO TBDP: 10.0000 mg | ORAL_TABLET | Freq: Every day | ORAL | Status: DC

## 2012-09-14 MED ORDER — FLUTICASONE PROPIONATE 50 MCG/ACT NA SUSP: 1.0000 | Freq: Every day | NASAL | Status: DC

## 2012-09-14 NOTE — Patient Instructions (Addendum)
F/U with rectal October 16 or after.  Labs and blood pressure are excellent, no med changes at this time  TSH, fasting lipid, cmp mid October  Mammogram will be scheduled at checkout when due   It is important that you exercise regularly at least 30 minutes 5 times a week. If you develop chest pain, have severe difficulty breathing, or feel very tired, stop exercising immediately and seek medical attention   New medication for allergies  Are sent to mail order

## 2012-09-14 NOTE — Assessment & Plan Note (Signed)
Annual wellness exam complete as documented. Pt is fully functional, has good health habits, and her chronic problems are well controlled. Immunization is up to date Cancer screening is up to date

## 2012-09-14 NOTE — Progress Notes (Signed)
Subjective:    Patient ID: Amanda [name], female    DOB: 08-03-1935, 77 y.o.   MRN: [id number]  HPI Preventive Screening-Counseling & Management   Patient present here today for a Medicare annual wellness visit.   Current Problems (verified)   Medications Prior to Visit Allergies (verified)   PAST HISTORY  Family History: 3 living sibs , brother died in his late 106's had enlarged heart Sister and mother also have enlarged  Heart Father died of a stroke in his early 22's ;   Social History Lives alone Mom of 3 adult children, never smoke , social alcohol, no street drugs, Works part time sitting with adults   Risk Factors  Current exercise habits: Walks her dog daily , will start 30 minutes also a gym member, plans to resume  Dietary issues discussed:Eats healthily , mainly fruit and vegetable, water   Cardiac risk factors:   Depression Screen  (Note: if answer to either of the following is "Yes", a more complete depression screening is indicated)   Over the past two weeks, have you felt down, depressed or hopeless? No  Over the past two weeks, have you felt little interest or pleasure in doing things? No  Have you lost interest or pleasure in daily life? No  Do you often feel hopeless? No  Do you cry easily over simple problems? No   Activities of Daily Living  In your present state of health, do you have any difficulty performing the following activities?  Driving?: No Managing money?: No, however her daughter balances her cheque book  On a monthly basis Feeding yourself?:No Getting from bed to chair?:No Climbing a flight of stairs?:No Preparing food and eating?:No Bathing or showering?:No Getting dressed?:No Getting to the toilet?:No Using the toilet?:No Moving around from place to place?: No  Fall Risk Assessment In the past year have you fallen or had a near fall?:No Are you currently taking any medications that make you dizziness?:No   Hearing  Difficulties: No Do you often ask people to speak up or repeat themselves?:No Do you experience ringing or noises in your ears?:No Do you have difficulty understanding soft or whispered voices?:No  Cognitive Testing  Alert? Yes Normal Appearance?Yes  Oriented to person? Yes Place? Yes  Time? Yes  Displays appropriate judgment?Yes  Can read the correct time from a watch face? yes Are you having problems remembering things?No  Advanced Directives have been discussed with the patient?Yes , full code, does not want to be in a vegetative state. Daughter is health care power of attorney   List the Names of Other Physician/Practitioners you currently use: Dr Melene Muller, [place] [facility]   Indicate any recent Medical Services you may have received from other than [facility] providers in the past year (date may be approximate).   Assessment:    Annual Wellness Exam   Plan:    During the course of the visit the patient was educated and counseled about appropriate screening and preventive services including:  A healthy diet is rich in fruit, vegetables and whole grains. Poultry fish, nuts and beans are a healthy choice for protein rather then red meat. A low sodium diet and drinking 64 ounces of water daily is generally recommended. Oils and sweet should be limited. Carbohydrates especially for those who are diabetic or overweight, should be limited to 30-45 gram per meal. It is important to eat on a regular schedule, at least 3 times daily. Snacks should be primarily fruits, vegetables or nuts. It  is important that you exercise regularly at least 30 minutes 5 times a week. If you develop chest pain, have severe difficulty breathing, or feel very tired, stop exercising immediately and seek medical attention  Immunization reviewed and updated. Cancer screening reviewed and updated    Patient Instructions (the written plan) was given to the patient.  Medicare Attestation  I have personally  reviewed:  The patient's medical and social history  Their use of alcohol, tobacco or illicit drugs  Their current medications and supplements  The patient's functional ability including ADLs,fall risks, home safety risks, cognitive, and hearing and visual impairment  Diet and physical activities  Evidence for depression or mood disorders  The patient's weight, height, BMI, and visual acuity have been recorded in the chart. I have made referrals, counseling, and provided education to the patient based on review of the above and I have provided the patient with a written personalized care plan for preventive services.      Review of Systems     Objective:   Physical Exam        Assessment & Plan:

## 2012-11-09 ENCOUNTER — Other Ambulatory Visit: Payer: Self-pay

## 2012-11-09 ENCOUNTER — Telehealth: Payer: Self-pay | Admitting: Family Medicine

## 2012-11-09 MED ORDER — ENALAPRIL-HYDROCHLOROTHIAZIDE 10-25 MG PO TABS: 1.0000 | ORAL_TABLET | Freq: Every day | ORAL | Status: DC

## 2012-11-09 MED ORDER — SIMVASTATIN 40 MG PO TABS: ORAL_TABLET | ORAL | Status: DC

## 2012-11-09 NOTE — Telephone Encounter (Signed)
meds refilled 

## 2013-01-10 ENCOUNTER — Other Ambulatory Visit: Payer: Self-pay | Admitting: Family Medicine

## 2013-01-10 DIAGNOSIS — Z139 Encounter for screening, unspecified: Secondary | ICD-10-CM

## 2013-01-13 ENCOUNTER — Ambulatory Visit (HOSPITAL_COMMUNITY)
Admission: RE | Admit: 2013-01-13 | Discharge: 2013-01-13 | Disposition: A | Discharge status: Home / Self Care | Payer: Medicare Other | Source: Ambulatory Visit | Attending: Family Medicine | Admitting: Family Medicine

## 2013-01-13 DIAGNOSIS — Z139 Encounter for screening, unspecified: Secondary | ICD-10-CM

## 2013-01-13 DIAGNOSIS — Z1231 Encounter for screening mammogram for malignant neoplasm of breast: Secondary | ICD-10-CM | POA: Insufficient documentation

## 2013-02-28 ENCOUNTER — Other Ambulatory Visit (HOSPITAL_COMMUNITY): Payer: Self-pay | Admitting: Cardiovascular Disease

## 2013-02-28 DIAGNOSIS — E782 Mixed hyperlipidemia: Secondary | ICD-10-CM

## 2013-03-16 ENCOUNTER — Encounter (HOSPITAL_COMMUNITY): Payer: PRIVATE HEALTH INSURANCE

## 2013-03-17 ENCOUNTER — Ambulatory Visit (HOSPITAL_COMMUNITY)
Admission: RE | Admit: 2013-03-17 | Discharge: 2013-03-17 | Disposition: A | Discharge status: Home / Self Care | Payer: Medicare Other | Source: Ambulatory Visit | Attending: Cardiovascular Disease | Admitting: Cardiovascular Disease

## 2013-03-17 DIAGNOSIS — E782 Mixed hyperlipidemia: Secondary | ICD-10-CM | POA: Insufficient documentation

## 2013-03-17 DIAGNOSIS — I6529 Occlusion and stenosis of unspecified carotid artery: Secondary | ICD-10-CM

## 2013-03-17 NOTE — Progress Notes (Signed)
Carotid Duplex Completed. Amanda [name], BS, RDMS, RVT  

## 2013-03-23 ENCOUNTER — Ambulatory Visit (INDEPENDENT_AMBULATORY_CARE_PROVIDER_SITE_OTHER): Payer: Medicare Other

## 2013-03-23 ENCOUNTER — Encounter (INDEPENDENT_AMBULATORY_CARE_PROVIDER_SITE_OTHER): Payer: Self-pay

## 2013-03-23 DIAGNOSIS — Z23 Encounter for immunization: Secondary | ICD-10-CM

## 2013-03-25 ENCOUNTER — Encounter: Payer: Self-pay | Admitting: *Deleted

## 2013-03-31 ENCOUNTER — Ambulatory Visit: Payer: Medicare Other | Admitting: Family Medicine

## 2013-04-14 ENCOUNTER — Other Ambulatory Visit: Payer: Self-pay | Admitting: Family Medicine

## 2013-04-14 LAB — COMPREHENSIVE METABOLIC PANEL
AST: 19 U/L (ref 0–37)
Albumin: 4.2 g/dL (ref 3.5–5.2)
Alkaline Phosphatase: 57 U/L (ref 39–117)
BUN: 14 mg/dL (ref 6–23)
Creat: 0.99 mg/dL (ref 0.50–1.10)
Potassium: 4 mEq/L (ref 3.5–5.3)
Total Protein: 6.9 g/dL (ref 6.0–8.3)

## 2013-04-14 LAB — LIPID PANEL
HDL: 67 mg/dL (ref >39)
LDL Cholesterol: 104 mg/dL — ABNORMAL HIGH (ref 0–99)
Total CHOL/HDL Ratio: 2.7 Ratio

## 2013-04-14 LAB — TSH: TSH: 2.315 u[IU]/mL (ref 0.350–4.500)

## 2013-04-19 ENCOUNTER — Ambulatory Visit (INDEPENDENT_AMBULATORY_CARE_PROVIDER_SITE_OTHER): Payer: Medicare Other | Admitting: Family Medicine

## 2013-04-19 ENCOUNTER — Encounter: Payer: Self-pay | Admitting: Family Medicine

## 2013-04-19 VITALS — BP 120/74 | HR 70 | Resp 16 | Ht 64.0 in | Wt 140.1 lb

## 2013-04-19 DIAGNOSIS — A048 Other specified bacterial intestinal infections: Secondary | ICD-10-CM

## 2013-04-19 DIAGNOSIS — R141 Gas pain: Secondary | ICD-10-CM

## 2013-04-19 DIAGNOSIS — Z1211 Encounter for screening for malignant neoplasm of colon: Secondary | ICD-10-CM

## 2013-04-19 DIAGNOSIS — R0989 Other specified symptoms and signs involving the circulatory and respiratory systems: Secondary | ICD-10-CM

## 2013-04-19 DIAGNOSIS — H409 Unspecified glaucoma: Secondary | ICD-10-CM

## 2013-04-19 DIAGNOSIS — I10 Essential (primary) hypertension: Secondary | ICD-10-CM

## 2013-04-19 DIAGNOSIS — I447 Left bundle-branch block, unspecified: Secondary | ICD-10-CM

## 2013-04-19 DIAGNOSIS — D649 Anemia, unspecified: Secondary | ICD-10-CM

## 2013-04-19 DIAGNOSIS — E785 Hyperlipidemia, unspecified: Secondary | ICD-10-CM

## 2013-04-19 DIAGNOSIS — R140 Abdominal distension (gaseous): Secondary | ICD-10-CM

## 2013-04-19 MED ORDER — ALIGN 4 MG PO CAPS: 1.0000 | ORAL_CAPSULE | Freq: Every day | ORAL | Status: DC

## 2013-04-19 MED ORDER — ENALAPRIL-HYDROCHLOROTHIAZIDE 10-25 MG PO TABS: 1.0000 | ORAL_TABLET | Freq: Every day | ORAL | Status: DC

## 2013-04-19 NOTE — Assessment & Plan Note (Signed)
Pt to be re evaluated by cardilogy, due Dec, recentlly had carotid doppler

## 2013-04-19 NOTE — Patient Instructions (Addendum)
Pelvic and breast  in 6 month, call if you need me before  You are referred for annual cardiology exam with Dr Allyson Sabal.  New medication for abdominal bloating to be tried is align, I will give you a script , pls fill locally if able, let us know if not and we will see  if any available  Samples  I will decide on next course of action whether imaging of gallbladder again or GI evaluation  After med trial has failed  Labs and blood pressure are excellent, continue healthy lifestyle  Rectal exam today is normal

## 2013-04-19 NOTE — Progress Notes (Signed)
  Subjective:    Patient ID: Amanda [name], female    DOB: 10-11-1935, 77 y.o.   MRN: [id number]  HPI The PT is here for follow up and re-evaluation of chronic medical conditions, medication management and review of any available recent lab and radiology data.  Preventive health is updated, specifically  Cancer screening and Immunization.   Questions or concerns regarding consultations or procedures which the PT has had in the interim are  Addressed.Doping well as far as vision is concerned and her glaucoma The PT denies any adverse reactions to current medications since the last visit.  1 month h/o increased abdominal bloating and "gas", no change in her normal diet to explain, and no specific aggravating foods. Denies heartburn Tried OTC probiotics, no  Relief Denies change in bowel movements, apetite is good     Review of Systems See HPI Denies recent fever or chills. Denies sinus pressure, nasal congestion, ear pain or sore throat. Denies chest congestion, productive cough or wheezing. Denies chest pains, palpitations and leg swelling Denies  nausea, vomiting,diarrhea or constipation.   Denies dysuria, frequency, hesitancy or incontinence. Denies joint pain, swelling and limitation in mobility. Denies headaches, seizures, numbness, or tingling. Denies depression, anxiety or insomnia. Denies skin break down or rash.        Objective:   Physical Exam  Patient alert and oriented and in no cardiopulmonary distress.  HEENT: No facial asymmetry, EOMI, no sinus tenderness,  oropharynx pink and moist.  Neck supple no adenopathy.  Chest: Clear to auscultation bilaterally.  CVS: S1, S2 no murmurs, no S3.  ABD: Soft non tender. Bowel sounds normal.No organomegaly or  mass Rectal: no mass, heme negative stool Ext: No edema  MS: Adequate ROM spine, shoulders, hips and knees.  Skin: Intact, no ulcerations or rash noted.  Psych: Good eye contact, normal affect. Memory intact  not anxious or depressed appearing.  CNS: CN 2-12 intact, power, tone and sensation normal throughout.       Assessment & Plan:

## 2013-04-21 MED ORDER — ALIGN 4 MG PO CAPS: 1.0000 | ORAL_CAPSULE | Freq: Every day | ORAL | Status: AC

## 2013-04-24 DIAGNOSIS — H409 Unspecified glaucoma: Secondary | ICD-10-CM | POA: Insufficient documentation

## 2013-04-24 NOTE — Assessment & Plan Note (Signed)
Treated by opthalmologist, on drops and has had laser surgery also

## 2013-04-24 NOTE — Assessment & Plan Note (Signed)
1 month h/o bloating, and  Excessive gas, trial of align, if no response, may need re imaging of gall bladder , re treatment of H pylori and GI re eval

## 2013-04-24 NOTE — Assessment & Plan Note (Signed)
Controlled, no change in medication DASH diet and commitment to daily physical activity for a minimum of 30 minutes discussed and encouraged, as a part of hypertension management. The importance of attaining a healthy weight is also discussed.  

## 2013-04-24 NOTE — Assessment & Plan Note (Signed)
Recently had carotid doppler through cardiology, will f/u with them

## 2013-04-24 NOTE — Assessment & Plan Note (Signed)
Increased LDL, needs to reduce fried and fatty foods. No med change

## 2013-04-25 NOTE — Addendum Note (Signed)
Addended by: Abner Greenspan on: 04/25/2013 04:39 PM   Modules accepted: Orders

## 2013-05-16 ENCOUNTER — Telehealth: Payer: Self-pay | Admitting: Family Medicine

## 2013-05-16 MED ORDER — PROMETHAZINE-DM 6.25-15 MG/5ML PO SYRP: 5.0000 mL | ORAL_SOLUTION | Freq: Four times a day (QID) | ORAL | Status: DC | PRN

## 2013-05-16 NOTE — Telephone Encounter (Signed)
Patient aware.

## 2013-05-16 NOTE — Telephone Encounter (Signed)
Pls send in phenergan DNM 5 cc at bedtime as needed, x 340 cc, and let her know of drowsy s/e , if she will; be at home OK to take twice daily

## 2013-05-16 NOTE — Telephone Encounter (Signed)
Please advise 

## 2013-05-18 ENCOUNTER — Ambulatory Visit: Payer: Medicare Other | Admitting: Cardiovascular Disease

## 2013-05-24 ENCOUNTER — Telehealth: Payer: Self-pay | Admitting: Family Medicine

## 2013-05-24 MED ORDER — SIMVASTATIN 40 MG PO TABS: ORAL_TABLET | ORAL | Status: DC

## 2013-05-24 NOTE — Telephone Encounter (Signed)
[facility]

## 2013-05-28 ENCOUNTER — Other Ambulatory Visit: Payer: Self-pay | Admitting: Family Medicine

## 2013-06-02 ENCOUNTER — Encounter: Payer: Self-pay | Admitting: Cardiovascular Disease

## 2013-06-02 ENCOUNTER — Ambulatory Visit (INDEPENDENT_AMBULATORY_CARE_PROVIDER_SITE_OTHER): Payer: Medicare Other | Admitting: Otolaryngology

## 2013-06-02 ENCOUNTER — Ambulatory Visit (INDEPENDENT_AMBULATORY_CARE_PROVIDER_SITE_OTHER): Payer: Medicare Other | Admitting: Cardiovascular Disease

## 2013-06-02 VITALS — BP 160/82 | HR 57 | Ht 62.0 in | Wt 141.0 lb

## 2013-06-02 DIAGNOSIS — I10 Essential (primary) hypertension: Secondary | ICD-10-CM

## 2013-06-02 DIAGNOSIS — E785 Hyperlipidemia, unspecified: Secondary | ICD-10-CM

## 2013-06-02 NOTE — Assessment & Plan Note (Signed)
On statin therapy with recent lipid profile performed 04/14/13 revealing a total cholesterol 184, LDL of 104 and HDL of 67

## 2013-06-02 NOTE — Progress Notes (Signed)
06/02/2013 Amanda [name]   12-18-1935  [id number]  Primary Physician Syliva Overman, MD Primary Cardiologist: Runell Gess MD Roseanne Reno   HPI:  The patient is a 77 year old, thin and fit-appearing, single [place] American female, mother of 3, grandmother to 3 grandchildren and great-grandmother to 1 great-grandchild who I saw a year ago. 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 Risk factors include hypertension and hyperlipidemia. She has intermittent left bundle-branch block. Her brother died of sudden death. Her father had a stroke at a young age. She had a 2D echo and Myoview performed September 2009 which were normal. Dr. Lodema Hong follows her lipid profile. She is otherwise asymptomatic . she had carotid Dopplers performed in our office 03/17/13 which were essentially normal.   Current Outpatient Prescriptions  Medication Sig Dispense Refill  . aspirin 81 MG tablet Take 81 mg by mouth daily.        . brinzolamide (AZOPT) 1 % ophthalmic suspension Place 1 drop into both eyes 3 (three) times daily.       . enalapril-hydrochlorothiazide (VASERETIC) 10-25 MG per tablet Take 1 tablet by mouth daily. One tablet by mouth once daily  90 tablet  1  . levobunolol (BETAGAN) 0.5 % ophthalmic solution Place 1 drop into the right eye every morning.       . Probiotic Product (ALIGN) 4 MG CAPS Take 1 capsule by mouth daily.  30 capsule  4  . simvastatin (ZOCOR) 40 MG tablet TAKE 1 TABLET ONCE DAILY  90 tablet  1  . Tafluprost (ZIOPTAN) 0.0015 % SOLN Apply 1 drop to eye. 1 drop in both eyes at bedtime       No current facility-administered medications for this visit.    No Known Allergies  History   Social History  . Marital Status: Single    Spouse Name: N/A    Number of Children: 3  . Years of Education: N/A   Occupational History  . caregiver , work part time     Social History Main Topics    . Smoking status: Never Smoker   . Smokeless tobacco: Never Used  . Alcohol Use: No     Comment: rare wine   . Drug Use: No  . Sexual Activity: No   Other Topics Concern  . Not on file   Social History Narrative  . No narrative on file     Review of Systems: General: negative for chills, fever, night sweats or weight changes.  Cardiovascular: negative for chest pain, dyspnea on exertion, edema, orthopnea, palpitations, paroxysmal nocturnal dyspnea or shortness of breath Dermatological: negative for rash Respiratory: negative for cough or wheezing Urologic: negative for hematuria Abdominal: negative for nausea, vomiting, diarrhea, bright red blood per rectum, melena, or hematemesis Neurologic: negative for visual changes, syncope, or dizziness All other systems reviewed and are otherwise negative except as noted above.    Blood pressure 160/82, pulse 57, height 5\' 2"  (1.575 m), weight 141 lb (63.957 kg).  General appearance: alert and no distress Neck: no adenopathy, no carotid bruit, no JVD, supple, symmetrical, trachea midline and thyroid not enlarged, symmetric, no tenderness/mass/nodules Lungs: clear to auscultation bilaterally Heart: regular rate and rhythm, S1, S2 normal, no murmur, click, rub or gallop Extremities: extremities normal, atraumatic, no cyanosis or edema  EKG sinus bradycardia at 57 with left bundle-branch block  ASSESSMENT AND PLAN:   HYPERTENSION Her blood pressure  is mildly elevated today. She does admit to dietary indiscretion last night regarding salt.  HYPERLIPIDEMIA On statin therapy with recent lipid profile performed 04/14/13 revealing a total cholesterol 184, LDL of 104 and HDL of 67      Runell Gess MD [facility], [facility] 06/02/2013 10:24 AM

## 2013-06-02 NOTE — Patient Instructions (Signed)
Your physician recommends that you schedule a follow-up appointment in: with Dr Berry in 1 year  

## 2013-06-02 NOTE — Assessment & Plan Note (Signed)
Her blood pressure is mildly elevated today. She does admit to dietary indiscretion last night regarding salt.

## 2013-06-03 ENCOUNTER — Encounter: Payer: Self-pay | Admitting: Cardiovascular Disease

## 2013-10-08 IMAGING — US US GUIDANCE NEEDLE PLACEMENT
1 series · 13 of 13 positions shown · non-contrast
Comparison: none

CLINICAL DATA: Right thyroid mass

ULTRASOUND GUIDED FINE NEEDLE ASPIRATION BIOPSY OF THYROID NODULE:
TECHNIQUE: Written informed consent for procedure obtained after discussion of
procedure and risks.
Time-out protocol performed.
Right lobe thyroid mass localized by ultrasound.
Mass measured 2.4 x 4.6 x 3.0 cm in size.

[Series 1: us guidance needle placement · 0.09mm/px · 13 acquisitions, 13 frames shown]
[im 1/13]
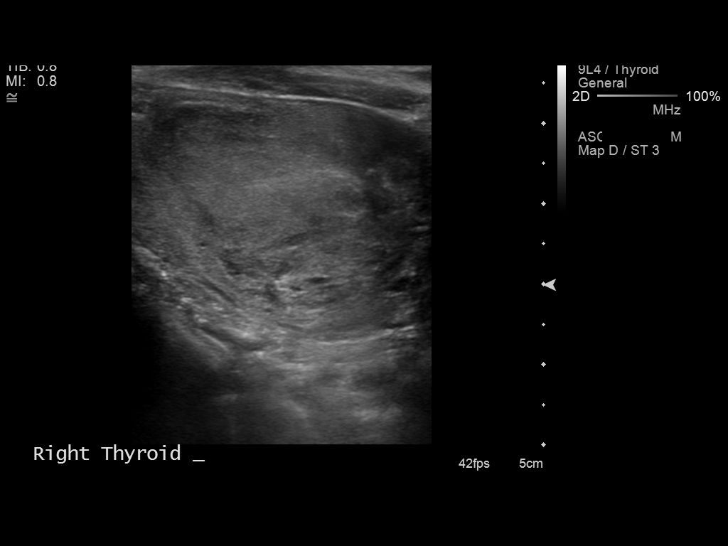
[im 2/13]
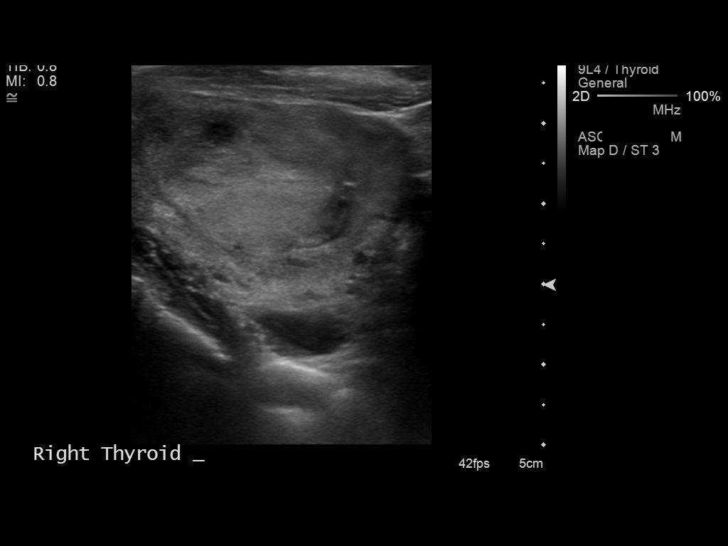
[im 3/13]
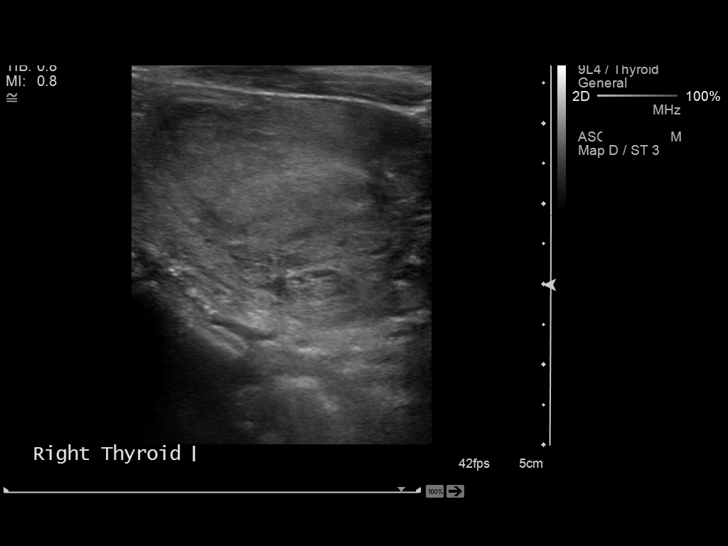
[im 4/13]
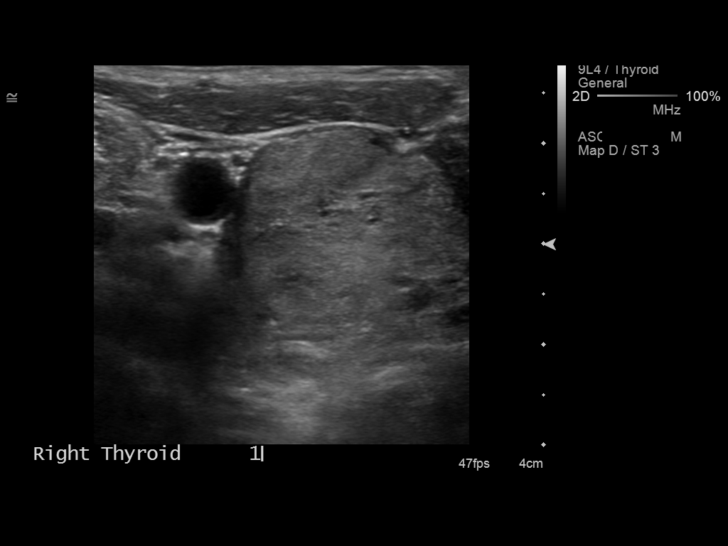
[im 5/13]
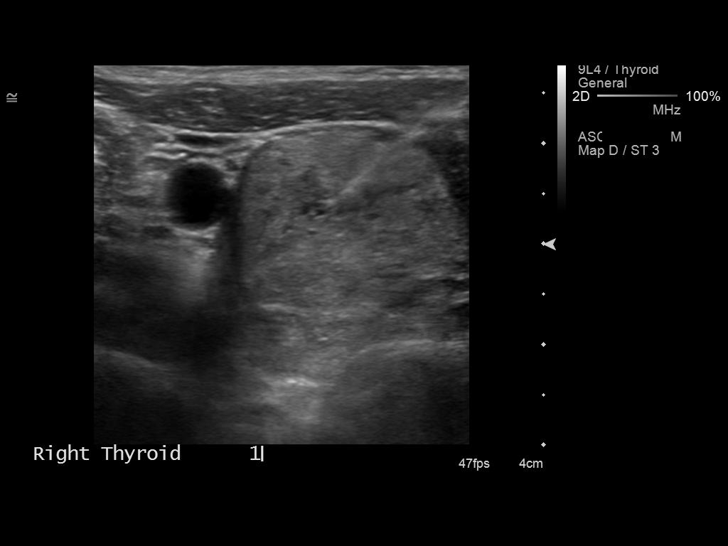
[im 6/13]
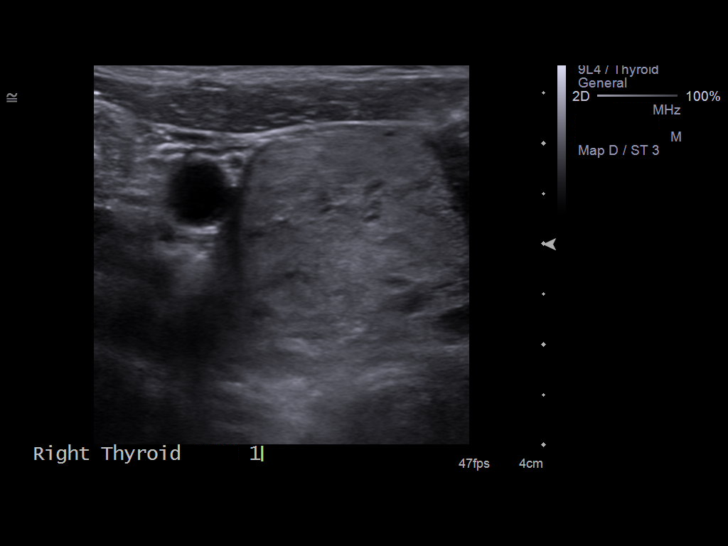
[im 7/13]
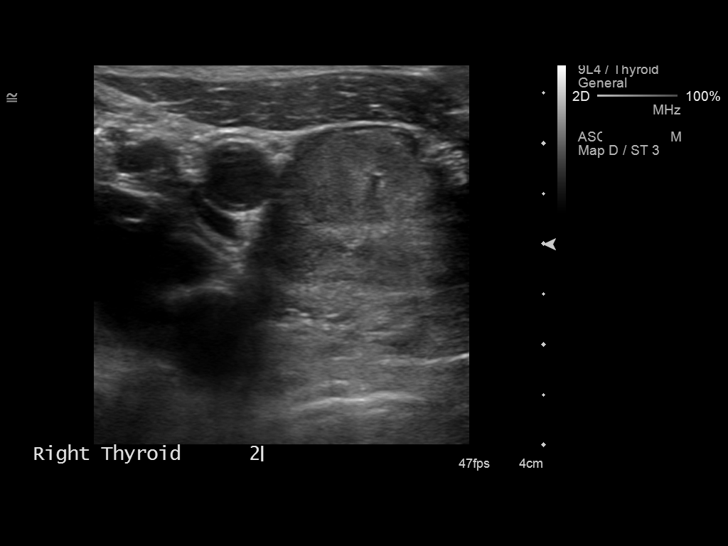
[im 8/13]
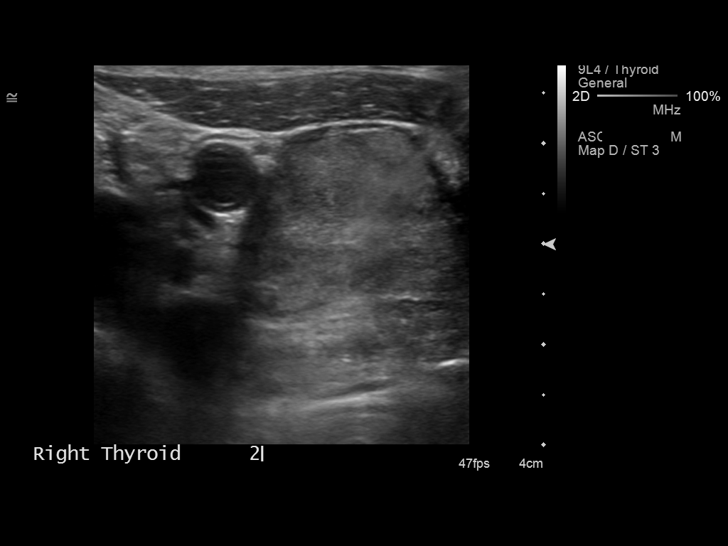
[im 9/13]
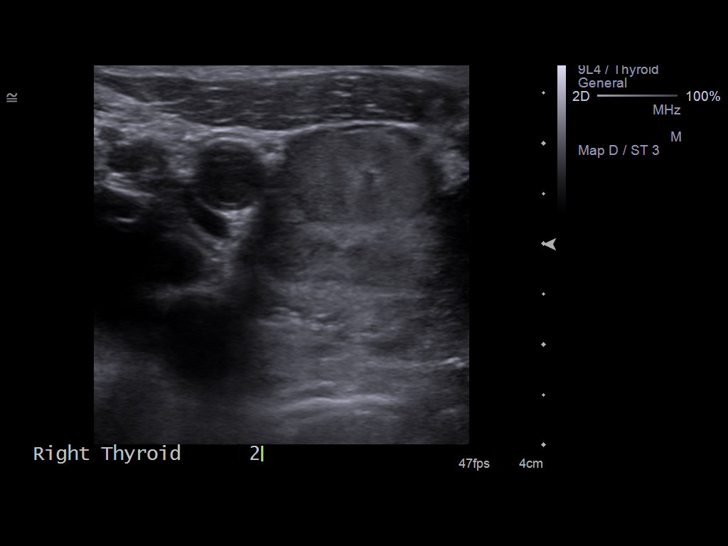
[im 10/13]
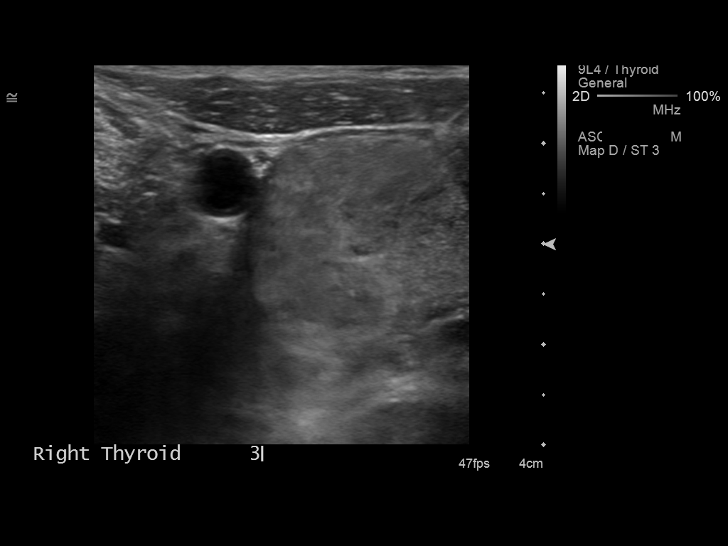
[im 11/13]
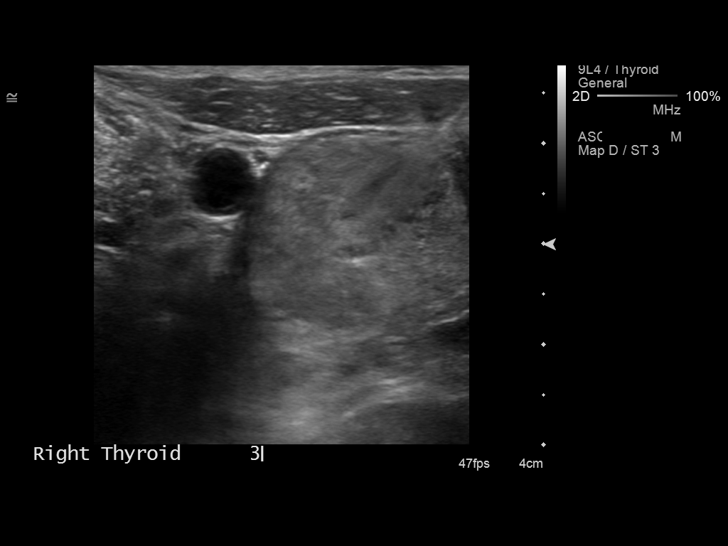
[im 12/13]
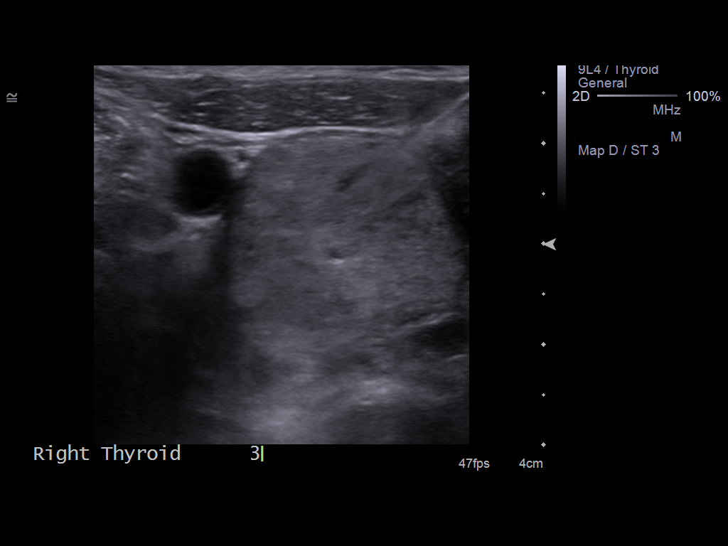
[im 13/13]
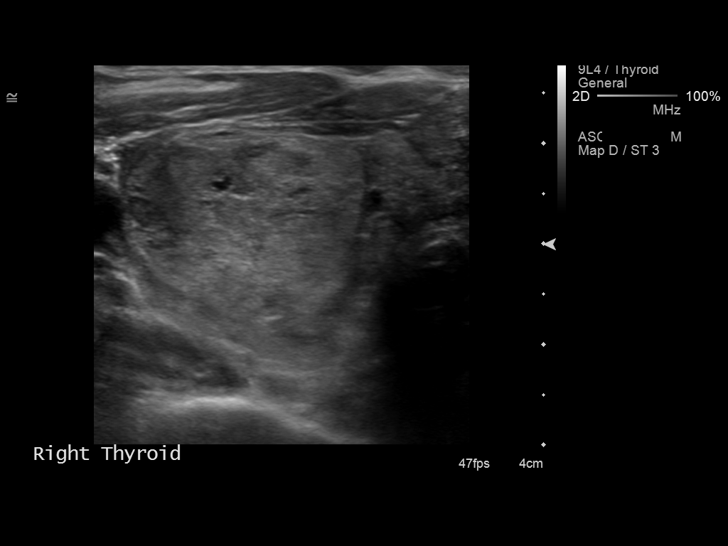

[13 of 13 positions shown; findings below may reference images not displayed]

Skin prepped and draped in usual sterile fashion.
Skin and soft tissues anesthetized with 1 ml of 2% idocaine.
Under sonographic guidance, three 25-gauge fine needle aspiration
biopsies of right thyroid mass were performed.
Procedure tolerated very well by patient without immediate
complication.
No evidence of hematoma on postprocedural images.
Standard post procedure instructions were given to patient.
Specimen sent to laboratory for analysis.
IMPRESSION: Ultrasound guided fine needle aspiration biopsy of a right lobe
thyroid mass.

## 2013-10-20 ENCOUNTER — Other Ambulatory Visit: Payer: Self-pay | Admitting: Family Medicine

## 2013-10-20 LAB — CBC WITH DIFFERENTIAL/PLATELET
Basophils Absolute: 0 10*3/uL (ref 0.0–0.1)
Basophils Relative: 1 % (ref 0–1)
Eosinophils Absolute: 0.1 10*3/uL (ref 0.0–0.7)
Eosinophils Relative: 3 % (ref 0–5)
HEMATOCRIT: 34.4 % — AB (ref 36.0–46.0)
HEMOGLOBIN: 11.9 g/dL — AB (ref 12.0–15.0)
LYMPHS ABS: 1.4 10*3/uL (ref 0.7–4.0)
Lymphocytes Relative: 37 % (ref 12–46)
MCH: 31 pg (ref 26.0–34.0)
MCHC: 34.6 g/dL (ref 30.0–36.0)
MCV: 89.6 fL (ref 78.0–100.0)
MONOS PCT: 8 % (ref 3–12)
Monocytes Absolute: 0.3 10*3/uL (ref 0.1–1.0)
NEUTROS ABS: 2 10*3/uL (ref 1.7–7.7)
Neutrophils Relative %: 51 % (ref 43–77)
Platelets: 259 10*3/uL (ref 150–400)
RBC: 3.84 MIL/uL — AB (ref 3.87–5.11)
RDW: 13.7 % (ref 11.5–15.5)
WBC: 3.9 10*3/uL — ABNORMAL LOW (ref 4.0–10.5)

## 2013-10-20 LAB — LIPID PANEL
CHOLESTEROL: 201 mg/dL — AB (ref 0–200)
HDL: 55 mg/dL (ref >39)
LDL CALC: 130 mg/dL — AB (ref 0–99)
Total CHOL/HDL Ratio: 3.7 Ratio
Triglycerides: 78 mg/dL (ref <150)
VLDL: 16 mg/dL (ref 0–40)

## 2013-10-20 LAB — COMPREHENSIVE METABOLIC PANEL
ALK PHOS: 57 U/L (ref 39–117)
ALT: 15 U/L (ref 0–35)
AST: 18 U/L (ref 0–37)
Albumin: 4.1 g/dL (ref 3.5–5.2)
BUN: 22 mg/dL (ref 6–23)
CO2: 28 mEq/L (ref 19–32)
Calcium: 9.4 mg/dL (ref 8.4–10.5)
Chloride: 102 mEq/L (ref 96–112)
Creat: 1 mg/dL (ref 0.50–1.10)
GLUCOSE: 84 mg/dL (ref 70–99)
POTASSIUM: 4.3 meq/L (ref 3.5–5.3)
SODIUM: 138 meq/L (ref 135–145)
TOTAL PROTEIN: 6.9 g/dL (ref 6.0–8.3)
Total Bilirubin: 0.5 mg/dL (ref 0.2–1.2)

## 2013-10-21 LAB — TSH: TSH: 1.834 u[IU]/mL (ref 0.350–4.500)

## 2013-10-25 ENCOUNTER — Encounter: Payer: Medicare Other | Admitting: Family Medicine

## 2013-11-24 ENCOUNTER — Encounter: Payer: Self-pay | Admitting: Family Medicine

## 2013-11-24 ENCOUNTER — Ambulatory Visit (INDEPENDENT_AMBULATORY_CARE_PROVIDER_SITE_OTHER): Payer: Medicare Other | Admitting: Family Medicine

## 2013-11-24 ENCOUNTER — Encounter (INDEPENDENT_AMBULATORY_CARE_PROVIDER_SITE_OTHER): Payer: Self-pay

## 2013-11-24 ENCOUNTER — Other Ambulatory Visit (HOSPITAL_COMMUNITY)
Admission: RE | Admit: 2013-11-24 | Discharge: 2013-11-24 | Disposition: A | Discharge status: Home / Self Care | Payer: Medicare Other | Source: Ambulatory Visit | Attending: Family Medicine | Admitting: Family Medicine

## 2013-11-24 VITALS — BP 124/78 | HR 82 | Resp 16 | Wt 141.8 lb

## 2013-11-24 DIAGNOSIS — Z0000 Encounter for general adult medical examination without abnormal findings: Secondary | ICD-10-CM

## 2013-11-24 DIAGNOSIS — I10 Essential (primary) hypertension: Secondary | ICD-10-CM

## 2013-11-24 DIAGNOSIS — Z1211 Encounter for screening for malignant neoplasm of colon: Secondary | ICD-10-CM | POA: Diagnosis not present

## 2013-11-24 DIAGNOSIS — Z1239 Encounter for other screening for malignant neoplasm of breast: Secondary | ICD-10-CM

## 2013-11-24 DIAGNOSIS — Z124 Encounter for screening for malignant neoplasm of cervix: Secondary | ICD-10-CM | POA: Diagnosis not present

## 2013-11-24 DIAGNOSIS — Z1231 Encounter for screening mammogram for malignant neoplasm of breast: Secondary | ICD-10-CM

## 2013-11-24 DIAGNOSIS — E785 Hyperlipidemia, unspecified: Secondary | ICD-10-CM

## 2013-11-24 DIAGNOSIS — Z13820 Encounter for screening for osteoporosis: Secondary | ICD-10-CM

## 2013-11-24 LAB — HEMOCCULT GUIAC POC 1CARD (OFFICE): Fecal Occult Blood, POC: NEGATIVE

## 2013-11-24 NOTE — Assessment & Plan Note (Signed)
Pelvic and breast exam as documented Pap sent. Stool is heme negative

## 2013-11-24 NOTE — Progress Notes (Signed)
   Subjective:    Patient ID: Amanda [name], female    DOB: 03/21/1936, 78 y.o.   MRN: [id number]  HPI The patient is for pelvic and breast exam. Health maintenance is reviewed and updated, recent labs are also reviewed, pt needs to reduce fat intake otherwise good    Review of Systems Se specific  concerns or complaints re health voiced at visit    Objective:   Physical Exam BP 124/78  Pulse 82  Resp 16  Wt 141 lb 12.8 oz (64.32 kg)  SpO2 99%  Pleasant well nourished female, alert and oriented x 3, in no cardio-pulmonary distress.   Breast: No asymetry,no masses or lumps. No tenderness. No nipple discharge or inversion. No axillary or supraclavicular adenopathy     Rectal:  Normal sphincter tone. No hemmorhoids.No rectal masses.  Guaiac negative stool.  GU: External genitalia normal female genitalia , female distribution of hair. No lesions. Urethral meatus normal in size, no  Prolapse, no lesions visibly  Present. Bladder non tender. Vagina pink and moist , with no visible lesions , physiologic  discharge present . Adequate pelvic support no  cystocele or rectocele noted Vaginal cuff appears health, moist, no ulcerative lesions or abnormal pigmentation noted Uterus absent, no adnexal masses, no  adnexal tenderness.         Assessment & Plan:  Routine general medical examination at a health care facility Pelvic and breast exam as documented Pap sent. Stool is heme negative

## 2013-11-24 NOTE — Patient Instructions (Signed)
F/u in 5 month, call if you need me before  You are referred for a bone density test  You are referred for a mammogram due 08/01/or after   Pls cut back on fatty and fried foods  Pls continue to keep active   Second pneumonia vaccine will be available  And we will administer  Fasting lipid, cmp and EGFR in 5 month  I hope that you are able to arrange to visit your sister soon

## 2013-11-25 ENCOUNTER — Other Ambulatory Visit: Payer: Self-pay

## 2013-11-25 MED ORDER — ENALAPRIL-HYDROCHLOROTHIAZIDE 10-25 MG PO TABS: 1.0000 | ORAL_TABLET | Freq: Every day | ORAL | Status: DC

## 2013-11-28 LAB — CYTOLOGY - PAP

## 2014-01-06 ENCOUNTER — Telehealth: Payer: Self-pay

## 2014-01-06 ENCOUNTER — Ambulatory Visit (HOSPITAL_COMMUNITY)
Admission: RE | Admit: 2014-01-06 | Discharge: 2014-01-06 | Disposition: A | Discharge status: Home / Self Care | Payer: Medicare Other | Source: Ambulatory Visit | Attending: Family Medicine | Admitting: Family Medicine

## 2014-01-06 DIAGNOSIS — M79641 Pain in right hand: Secondary | ICD-10-CM

## 2014-01-06 DIAGNOSIS — M19049 Primary osteoarthritis, unspecified hand: Secondary | ICD-10-CM | POA: Insufficient documentation

## 2014-01-06 DIAGNOSIS — M79609 Pain in unspecified limb: Secondary | ICD-10-CM | POA: Diagnosis present

## 2014-01-06 NOTE — Telephone Encounter (Signed)
The xray report is available and I have a follow up on the result for you to convey to the pt in a result note attatched to the xray report, pls contact pt with the message

## 2014-01-06 NOTE — Telephone Encounter (Signed)
Noted  

## 2014-01-06 NOTE — Telephone Encounter (Signed)
I tried to call the pt, patient is getting an xray, I will send recommendations after I see the report

## 2014-01-07 ENCOUNTER — Telehealth: Payer: Self-pay | Admitting: Family Medicine

## 2014-01-07 DIAGNOSIS — M79641 Pain in right hand: Secondary | ICD-10-CM

## 2014-01-07 DIAGNOSIS — Z9181 History of falling: Secondary | ICD-10-CM

## 2014-01-07 NOTE — Telephone Encounter (Signed)
Called pt states she fell on 07/23 her right hand got caught in trash bucket, which slid away from her , she fell forward  With the bucket, since then she had pain and swelling of hand and was unable to move her fingers for the initial 24 hours. Reports improvement in her symptoms though there is still swelling , discomfort and limitation with movement She is being referred for ortho eval and agrees pls call her with appt asap

## 2014-01-16 ENCOUNTER — Ambulatory Visit (HOSPITAL_COMMUNITY): Payer: Medicare Other

## 2014-01-16 ENCOUNTER — Other Ambulatory Visit (HOSPITAL_COMMUNITY): Payer: Medicare Other

## 2014-01-17 ENCOUNTER — Other Ambulatory Visit (HOSPITAL_COMMUNITY): Payer: Medicare Other

## 2014-01-17 ENCOUNTER — Ambulatory Visit (HOSPITAL_COMMUNITY): Payer: Medicare Other

## 2014-01-18 ENCOUNTER — Ambulatory Visit (HOSPITAL_COMMUNITY): Payer: Medicare Other

## 2014-01-18 ENCOUNTER — Ambulatory Visit (HOSPITAL_COMMUNITY)
Admission: RE | Admit: 2014-01-18 | Discharge: 2014-01-18 | Disposition: A | Discharge status: Home / Self Care | Payer: Medicare Other | Source: Ambulatory Visit | Attending: Family Medicine | Admitting: Family Medicine

## 2014-01-18 ENCOUNTER — Other Ambulatory Visit (HOSPITAL_COMMUNITY): Payer: Medicare Other

## 2014-01-18 DIAGNOSIS — Z1231 Encounter for screening mammogram for malignant neoplasm of breast: Secondary | ICD-10-CM | POA: Insufficient documentation

## 2014-01-18 DIAGNOSIS — Z13820 Encounter for screening for osteoporosis: Secondary | ICD-10-CM | POA: Insufficient documentation

## 2014-03-16 ENCOUNTER — Encounter (INDEPENDENT_AMBULATORY_CARE_PROVIDER_SITE_OTHER): Payer: Self-pay

## 2014-03-16 ENCOUNTER — Ambulatory Visit (INDEPENDENT_AMBULATORY_CARE_PROVIDER_SITE_OTHER): Payer: Medicare Other

## 2014-03-16 DIAGNOSIS — Z23 Encounter for immunization: Secondary | ICD-10-CM

## 2014-03-20 ENCOUNTER — Telehealth: Payer: Self-pay | Admitting: Cardiovascular Disease

## 2014-03-20 NOTE — Telephone Encounter (Signed)
Closed encounter °

## 2014-04-25 ENCOUNTER — Ambulatory Visit: Payer: Medicare Other | Admitting: Family Medicine

## 2014-04-28 ENCOUNTER — Telehealth: Payer: Self-pay | Admitting: Family Medicine

## 2014-04-28 ENCOUNTER — Other Ambulatory Visit: Payer: Self-pay

## 2014-04-28 MED ORDER — PROMETHAZINE-DM 6.25-15 MG/5ML PO SYRP: ORAL_SOLUTION | ORAL | Status: DC

## 2014-04-28 NOTE — Telephone Encounter (Signed)
Excess cough started last night, no fever, chills or discolored sputum. No sinus pressure or pain, slightly increased nasal congestion, requests cough suppressant. This is sent in and she is advised to start saline nasal flushes   Pls send in historically entered phenergan DM to her local pharmacy, you will need to verify the name and location , not currently in her record, she wants script locally  ??pls ask

## 2014-04-28 NOTE — Telephone Encounter (Signed)
Called patient and left message for them to return call at the office   

## 2014-04-28 NOTE — Telephone Encounter (Signed)
Unable to reach patient called and told her I was sending the med to [address]

## 2014-05-16 ENCOUNTER — Encounter: Payer: Self-pay | Admitting: Cardiovascular Disease

## 2014-05-16 ENCOUNTER — Ambulatory Visit (INDEPENDENT_AMBULATORY_CARE_PROVIDER_SITE_OTHER): Payer: Medicare Other | Admitting: Cardiovascular Disease

## 2014-05-16 VITALS — BP 132/70 | HR 57 | Ht 62.0 in | Wt 139.4 lb

## 2014-05-16 DIAGNOSIS — I10 Essential (primary) hypertension: Secondary | ICD-10-CM

## 2014-05-16 DIAGNOSIS — I447 Left bundle-branch block, unspecified: Secondary | ICD-10-CM

## 2014-05-16 LAB — COMPLETE METABOLIC PANEL WITH GFR
ALT: 17 U/L (ref 0–35)
AST: 18 U/L (ref 0–37)
Albumin: 3.8 g/dL (ref 3.5–5.2)
Alkaline Phosphatase: 59 U/L (ref 39–117)
BUN: 17 mg/dL (ref 6–23)
CO2: 27 mEq/L (ref 19–32)
Calcium: 9.2 mg/dL (ref 8.4–10.5)
Chloride: 107 mEq/L (ref 96–112)
Creat: 0.96 mg/dL (ref 0.50–1.10)
GFR, Est African American: 66 mL/min
GFR, Est Non African American: 58 mL/min — ABNORMAL LOW
Glucose, Bld: 88 mg/dL (ref 70–99)
Potassium: 3.9 mEq/L (ref 3.5–5.3)
Sodium: 142 mEq/L (ref 135–145)
Total Bilirubin: 0.4 mg/dL (ref 0.2–1.2)
Total Protein: 6.7 g/dL (ref 6.0–8.3)

## 2014-05-16 LAB — LIPID PANEL
Cholesterol: 156 mg/dL (ref 0–200)
HDL: 50 mg/dL (ref >39)
LDL CALC: 93 mg/dL (ref 0–99)
TRIGLYCERIDES: 64 mg/dL (ref <150)
Total CHOL/HDL Ratio: 3.1 Ratio
VLDL: 13 mg/dL (ref 0–40)

## 2014-05-16 NOTE — Assessment & Plan Note (Signed)
History of hypertension with blood pressure measured today 132/70. She is on enalapril hydrochlorothiazide. Continue current medications at current dosing

## 2014-05-16 NOTE — Progress Notes (Signed)
05/16/2014 Amanda [name]   29-Jul-1935  [id number]  Primary Physician Tula Nakayama, MD Primary Cardiologist: Lorretta Harp MD Renae Gloss   HPI:  The patient is a 78 year old, thin and fit-appearing, single [place] American female, mother of 78, grandmother to 3 grandchildren and great-grandmother to 1 great-grandchild who I saw a year ago. 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 Risk factors include hypertension and hyperlipidemia. She has intermittent left bundle-branch block. Her brother died of sudden death. Her father had a stroke at a young age. She had a 2D echo and Myoview performed September 2009 which were normal. Dr. Moshe Cipro follows her lipid profile which were recently performed 05/15/14 revealing a total cholesterol of 156, LDL 93 and HDL of 50. She is otherwise asymptomatic . she had carotid Dopplers performed in our office 03/17/13 which were essentially normal.   Current Outpatient Prescriptions  Medication Sig Dispense Refill  . aspirin 81 MG tablet Take 81 mg by mouth daily.      . brinzolamide (AZOPT) 1 % ophthalmic suspension Place 1 drop into both eyes 3 (three) times daily.     . enalapril-hydrochlorothiazide (VASERETIC) 10-25 MG per tablet Take 1 tablet by mouth daily. One tablet by mouth once daily 90 tablet 1  . promethazine-dextromethorphan (PROMETHAZINE-DM) 6.25-15 MG/5ML syrup One teaspoon at bedtime, as needed, for excessive cough 240 mL 0  . simvastatin (ZOCOR) 40 MG tablet TAKE 1 TABLET ONCE DAILY 90 tablet 1  . Tafluprost (ZIOPTAN) 0.0015 % SOLN Apply 1 drop to eye. 1 drop in both eyes at bedtime     No current facility-administered medications for this visit.    No Known Allergies  History   Social History  . Marital Status: Single    Spouse Name: N/A    Number of Children: 3  . Years of Education: N/A   Occupational History  . caregiver , work part time      Social History Main Topics  . Smoking status: Never Smoker   . Smokeless tobacco: Never Used  . Alcohol Use: No     Comment: rare wine   . Drug Use: No  . Sexual Activity: No   Other Topics Concern  . Not on file   Social History Narrative     Review of Systems: General: negative for chills, fever, night sweats or weight changes.  Cardiovascular: negative for chest pain, dyspnea on exertion, edema, orthopnea, palpitations, paroxysmal nocturnal dyspnea or shortness of breath Dermatological: negative for rash Respiratory: negative for cough or wheezing Urologic: negative for hematuria Abdominal: negative for nausea, vomiting, diarrhea, bright red blood per rectum, melena, or hematemesis Neurologic: negative for visual changes, syncope, or dizziness All other systems reviewed and are otherwise negative except as noted above.    Blood pressure 132/70, pulse 57, height 5\' 2"  (1.575 m), weight 139 lb 6.4 oz (63.231 kg).  General appearance: alert and no distress Neck: no adenopathy, no carotid bruit, no JVD, supple, symmetrical, trachea midline and thyroid not enlarged, symmetric, no tenderness/mass/nodules Lungs: clear to auscultation bilaterally Heart: regular rate and rhythm, S1, S2 normal, no murmur, click, rub or gallop Extremities: extremities normal, atraumatic, no cyanosis or edema  EKG sinus bradycardia 57 with left bundle branch block unchanged from prior EKGs. I personally reviewed this EKG  ASSESSMENT AND PLAN:   Essential hypertension History of hypertension with blood pressure measured today 132/70. She is on enalapril  hydrochlorothiazide. Continue current medications at current dosing  LBBB (left bundle branch block) She has left bundle block today on her EKG  HYPERLIPIDEMIA History of hyperlipidemia on simvastatin 40 mg a day. Her most recent lipid profile performed 05/15/14 revealed a total cholesterol of 156, LDL of 93 and HDL of 50. Continue current  medications at current dosing      Lorretta Harp MD [facility], [facility] 05/16/2014 11:07 AM

## 2014-05-16 NOTE — Assessment & Plan Note (Signed)
History of hyperlipidemia on simvastatin 40 mg a day. Her most recent lipid profile performed 05/15/14 revealed a total cholesterol of 156, LDL of 93 and HDL of 50. Continue current medications at current dosing

## 2014-05-16 NOTE — Patient Instructions (Signed)
Your physician wants you to follow-up in 1 year with Dr. Berry. You will receive a reminder letter in the mail 2 months in advance. If you do not receive a letter, please call our office to schedule the follow-up appointment.  

## 2014-05-16 NOTE — Assessment & Plan Note (Signed)
She has left bundle block today on her EKG

## 2014-05-17 ENCOUNTER — Ambulatory Visit (INDEPENDENT_AMBULATORY_CARE_PROVIDER_SITE_OTHER): Payer: Medicare Other | Admitting: Family Medicine

## 2014-05-17 ENCOUNTER — Encounter: Payer: Self-pay | Admitting: Family Medicine

## 2014-05-17 VITALS — BP 138/72 | HR 74 | Resp 18 | Ht 62.0 in | Wt 138.1 lb

## 2014-05-17 DIAGNOSIS — I10 Essential (primary) hypertension: Secondary | ICD-10-CM

## 2014-05-17 DIAGNOSIS — F418 Other specified anxiety disorders: Secondary | ICD-10-CM | POA: Insufficient documentation

## 2014-05-17 DIAGNOSIS — F5105 Insomnia due to other mental disorder: Secondary | ICD-10-CM

## 2014-05-17 DIAGNOSIS — F341 Dysthymic disorder: Secondary | ICD-10-CM

## 2014-05-17 DIAGNOSIS — E785 Hyperlipidemia, unspecified: Secondary | ICD-10-CM

## 2014-05-17 DIAGNOSIS — R058 Other specified cough: Secondary | ICD-10-CM

## 2014-05-17 DIAGNOSIS — R05 Cough: Secondary | ICD-10-CM

## 2014-05-17 DIAGNOSIS — H409 Unspecified glaucoma: Secondary | ICD-10-CM

## 2014-05-17 MED ORDER — TEMAZEPAM 7.5 MG PO CAPS: 7.5000 mg | ORAL_CAPSULE | Freq: Every evening | ORAL | Status: DC | PRN

## 2014-05-17 NOTE — Progress Notes (Signed)
   Subjective:    Patient ID: Amanda [name], female    DOB: April 01, 1936, 78 y.o.   MRN: [id number]  HPI The PT is here for follow up and re-evaluation of chronic medical conditions, medication management and review of any available recent lab and radiology data.  Preventive health is updated, specifically  Cancer screening and Immunization.   Questions or concerns regarding consultations or procedures which the PT has had in the interim are  Addressed.Recently seen by cardiology, good bill of health, also follows with opthalmology and vision is improved The PT denies any adverse reactions to current medications since the last visit.  C/o recent increase in difficulty both falling and staying asleep, also has dry hacking cough, did not respond ot steroids and she used home made remedy       Review of Systems See HPI Denies recent fever or chills. Denies sinus pressure, nasal congestion, ear pain or sore throat. Denies chest congestion, productive cough or wheezing. Denies chest pains, palpitations and leg swelling Denies abdominal pain, nausea, vomiting,diarrhea or constipation.   Denies dysuria, frequency, hesitancy or incontinence. Denies joint pain, swelling and limitation in mobility. Denies headaches, seizures, numbness, or tingling.  Denies skin break down or rash.        Objective:   Physical Exam BP 138/72 mmHg  Pulse 74  Resp 18  Ht 5\' 2"  (1.575 m)  Wt 138 lb 1.9 oz (62.651 kg)  BMI 25.26 kg/m2  SpO2 97% Patient alert and oriented and in no cardiopulmonary distress.  HEENT: No facial asymmetry, EOMI,   oropharynx pink and moist.  Neck supple no JVD, no mass.  Chest: Clear to auscultation bilaterally.  CVS: S1, S2 no murmurs, no S3.Regular rate.  ABD: Soft non tender.   Ext: No edema  MS: Adequate ROM spine, shoulders, hips and knees.  Skin: Intact, no ulcerations or rash noted.  Psych: Good eye contact, normal affect. Memory intact mildly  anxious not   depressed appearing.  CNS: CN 2-12 intact, power,  normal throughout.no focal deficits noted.        Assessment & Plan:  Essential hypertension Controlled, no change in medication DASH diet and commitment to daily physical activity for a minimum of 30 minutes discussed and encouraged, as a part of hypertension management. The importance of maintaining a healthy weight is also discussed.   Insomnia secondary to depression with anxiety Increased difficulty falling asleep and staying asleep. Sleep hygiene reviewed and written information offered also. Prescription sent for  medication needed.   Hyperlipidemia LDL goal <100 Hyperlipidemia:Low fat diet discussed and encouraged.  Controlled, no change in medication   Allergic cough Reports resolution with "home remedy " cough medication, will need some f/u to see if this may be an ACE related cough, recent onset of uncontrolled symptoms however

## 2014-05-17 NOTE — Patient Instructions (Addendum)
Annual wellness in 5 month, call if you need me before  Congrats on excellent health and labs  New med for sleep

## 2014-05-29 ENCOUNTER — Other Ambulatory Visit: Payer: Self-pay

## 2014-05-29 ENCOUNTER — Telehealth: Payer: Self-pay

## 2014-05-29 DIAGNOSIS — F418 Other specified anxiety disorders: Secondary | ICD-10-CM

## 2014-05-29 DIAGNOSIS — F5105 Insomnia due to other mental disorder: Principal | ICD-10-CM

## 2014-05-29 MED ORDER — TEMAZEPAM 7.5 MG PO CAPS: 7.5000 mg | ORAL_CAPSULE | Freq: Every evening | ORAL | Status: DC | PRN

## 2014-05-29 MED ORDER — SIMVASTATIN 40 MG PO TABS: ORAL_TABLET | ORAL | Status: AC

## 2014-05-29 NOTE — Telephone Encounter (Signed)
Noted and [facility]

## 2014-06-06 ENCOUNTER — Telehealth: Payer: Self-pay | Admitting: Family Medicine

## 2014-06-06 ENCOUNTER — Other Ambulatory Visit: Payer: Self-pay

## 2014-06-06 DIAGNOSIS — F5105 Insomnia due to other mental disorder: Principal | ICD-10-CM

## 2014-06-06 DIAGNOSIS — F418 Other specified anxiety disorders: Secondary | ICD-10-CM

## 2014-06-06 MED ORDER — TEMAZEPAM 7.5 MG PO CAPS: 7.5000 mg | ORAL_CAPSULE | Freq: Every evening | ORAL | Status: DC | PRN

## 2014-06-06 NOTE — Telephone Encounter (Signed)
Noted and faxed.  

## 2014-06-16 NOTE — Assessment & Plan Note (Signed)
Increased difficulty falling asleep and staying asleep. Sleep hygiene reviewed and written information offered also. Prescription sent for  medication needed.

## 2014-06-16 NOTE — Assessment & Plan Note (Signed)
Reports improvement and stability as far as intra occular pressure is concerned

## 2014-06-16 NOTE — Assessment & Plan Note (Signed)
Reports resolution with "home remedy " cough medication, will need some f/u to see if this may be an ACE related cough, recent onset of uncontrolled symptoms however

## 2014-06-16 NOTE — Assessment & Plan Note (Signed)
Hyperlipidemia:Low fat diet discussed and encouraged.  Controlled, no change in medication   

## 2014-06-16 NOTE — Assessment & Plan Note (Signed)
Controlled, no change in medication DASH diet and commitment to daily physical activity for a minimum of 30 minutes discussed and encouraged, as a part of hypertension management. The importance of maintaining a healthy weight is also discussed.

## 2014-08-10 ENCOUNTER — Telehealth: Payer: Self-pay

## 2014-08-10 NOTE — Telephone Encounter (Signed)
I spoke directly with the pt. States she is no longer having any pain, which was primarily under the breast. States she has been moving things, cleaning up her house for sale, also was at the gym on Monday. No pain now. Her last mammogram was normal and less than 1 year ago. She States she was told on Tuesday pm that I was in class and that  you would contact her in am.States this is not good medical or business practice with which I agree. No documentation of any of this in her  chart , though you confirm this conversation. I explained that I am only today getting the message. I apologized for delay in phone call return, and made it clear that I agreed the call was not handled as it should have been, I apologized and I advised her that she will get another call from the office about this. Since we have spoken about this on the phone , I am now forwarding this so that you make the follow up call to the pt

## 2014-08-10 NOTE — Telephone Encounter (Signed)
Spoke with patient and she accepts apology for oversight.  Ensured her that this will not happen again.

## 2014-08-25 ENCOUNTER — Telehealth: Payer: Self-pay

## 2014-08-25 DIAGNOSIS — I10 Essential (primary) hypertension: Secondary | ICD-10-CM

## 2014-08-25 DIAGNOSIS — E785 Hyperlipidemia, unspecified: Secondary | ICD-10-CM

## 2014-08-25 DIAGNOSIS — E559 Vitamin D deficiency, unspecified: Secondary | ICD-10-CM

## 2014-08-25 NOTE — Telephone Encounter (Signed)
Labs ordered prior to [facility]

## 2014-08-25 NOTE — Telephone Encounter (Signed)
-----  Message from Fayrene Helper, MD sent at 08/25/2014  9:06 AM EST ----- Regarding: help please Pt is relocating up [place] near family, needs appt for annual wellness scheduled  prior pls. March 24 looks open, let her know also She will need her prevnar then She needs fasting lipid, cmp and EGFR, CBC, T SH and vit D done 1 week prior please order, and let her know  Thanks!

## 2014-08-28 ENCOUNTER — Other Ambulatory Visit: Payer: Self-pay

## 2014-08-28 ENCOUNTER — Telehealth: Payer: Self-pay | Admitting: Family Medicine

## 2014-08-28 NOTE — Telephone Encounter (Signed)
Order faxed.

## 2014-08-31 LAB — CBC
HCT: 34.8 % — ABNORMAL LOW (ref 36.0–46.0)
HEMOGLOBIN: 11.4 g/dL — AB (ref 12.0–15.0)
MCH: 30.2 pg (ref 26.0–34.0)
MCHC: 32.8 g/dL (ref 30.0–36.0)
MCV: 92.1 fL (ref 78.0–100.0)
MPV: 9 fL (ref 8.6–12.4)
Platelets: 280 10*3/uL (ref 150–400)
RBC: 3.78 MIL/uL — ABNORMAL LOW (ref 3.87–5.11)
RDW: 14.2 % (ref 11.5–15.5)
WBC: 2.8 10*3/uL — AB (ref 4.0–10.5)

## 2014-08-31 LAB — COMPLETE METABOLIC PANEL WITH GFR
ALBUMIN: 4.2 g/dL (ref 3.5–5.2)
ALK PHOS: 55 U/L (ref 39–117)
ALT: 20 U/L (ref 0–35)
AST: 21 U/L (ref 0–37)
BUN: 11 mg/dL (ref 6–23)
CO2: 28 mEq/L (ref 19–32)
Calcium: 9.6 mg/dL (ref 8.4–10.5)
Chloride: 102 mEq/L (ref 96–112)
Creat: 0.9 mg/dL (ref 0.50–1.10)
GFR, EST AFRICAN AMERICAN: 72 mL/min
GFR, Est Non African American: 62 mL/min
GLUCOSE: 84 mg/dL (ref 70–99)
Potassium: 3.9 mEq/L (ref 3.5–5.3)
Sodium: 138 mEq/L (ref 135–145)
Total Bilirubin: 0.6 mg/dL (ref 0.2–1.2)
Total Protein: 7.2 g/dL (ref 6.0–8.3)

## 2014-08-31 LAB — VITAMIN D 25 HYDROXY (VIT D DEFICIENCY, FRACTURES): Vit D, 25-Hydroxy: 33 ng/mL (ref 30–100)

## 2014-08-31 LAB — LIPID PANEL
Cholesterol: 204 mg/dL — ABNORMAL HIGH (ref 0–200)
HDL: 71 mg/dL (ref >=46)
LDL Cholesterol: 123 mg/dL — ABNORMAL HIGH (ref 0–99)
TRIGLYCERIDES: 49 mg/dL (ref <150)
Total CHOL/HDL Ratio: 2.9 Ratio
VLDL: 10 mg/dL (ref 0–40)

## 2014-08-31 LAB — TSH: TSH: 1 u[IU]/mL (ref 0.350–4.500)

## 2014-09-06 ENCOUNTER — Ambulatory Visit (INDEPENDENT_AMBULATORY_CARE_PROVIDER_SITE_OTHER): Payer: Medicare Other | Admitting: Family Medicine

## 2014-09-06 ENCOUNTER — Encounter: Payer: Self-pay | Admitting: Family Medicine

## 2014-09-06 VITALS — BP 132/80 | HR 71 | Resp 16 | Ht 62.0 in | Wt 130.0 lb

## 2014-09-06 DIAGNOSIS — F5105 Insomnia due to other mental disorder: Secondary | ICD-10-CM

## 2014-09-06 DIAGNOSIS — F419 Anxiety disorder, unspecified: Secondary | ICD-10-CM

## 2014-09-06 DIAGNOSIS — F418 Other specified anxiety disorders: Secondary | ICD-10-CM

## 2014-09-06 DIAGNOSIS — R799 Abnormal finding of blood chemistry, unspecified: Secondary | ICD-10-CM

## 2014-09-06 DIAGNOSIS — R634 Abnormal weight loss: Secondary | ICD-10-CM | POA: Diagnosis not present

## 2014-09-06 DIAGNOSIS — I10 Essential (primary) hypertension: Secondary | ICD-10-CM

## 2014-09-06 DIAGNOSIS — E785 Hyperlipidemia, unspecified: Secondary | ICD-10-CM

## 2014-09-06 DIAGNOSIS — F341 Dysthymic disorder: Secondary | ICD-10-CM

## 2014-09-06 DIAGNOSIS — Z1211 Encounter for screening for malignant neoplasm of colon: Secondary | ICD-10-CM | POA: Diagnosis not present

## 2014-09-06 LAB — HEMOCCULT GUIAC POC 1CARD (OFFICE): Fecal Occult Blood, POC: NEGATIVE

## 2014-09-06 MED ORDER — TEMAZEPAM 7.5 MG PO CAPS: 7.5000 mg | ORAL_CAPSULE | Freq: Every evening | ORAL | Status: AC | PRN

## 2014-09-06 MED ORDER — ENALAPRIL-HYDROCHLOROTHIAZIDE 10-25 MG PO TABS: 1.0000 | ORAL_TABLET | Freq: Every day | ORAL | Status: AC

## 2014-09-06 NOTE — Patient Instructions (Addendum)
All the very best, you will be missed, but I know this is for your best  Please start taking your cholesterol medication every evening, it is most important that you take it ,plan to take it on average 2 hours before the restoril  Blood pressure  Is normal, and all cancer screening tests are up to date  Please try to eat regularly, since you need to get your weight  Up, please also take your tonic 2 times every day, 30 minutes  before the  meal .  ONCE YOU FEEL LESS ANXIOUS AND STRESSED, which is absolutely understable with your current moving situation, your apetie and sleep will improve.  You need to have your blood count followed up, stool exam today shows no hidden blood in stool which is excellent, you are a bit anemic and white blood cell  count slightly low, your new Doc will follow up on this.  Abdomen is soft and not tender  You do need the Prevnar vaccine   Continue  To take great care of yourself, and I know, all will get better

## 2014-09-24 DIAGNOSIS — F419 Anxiety disorder, unspecified: Secondary | ICD-10-CM | POA: Insufficient documentation

## 2014-09-24 DIAGNOSIS — R799 Abnormal finding of blood chemistry, unspecified: Secondary | ICD-10-CM | POA: Insufficient documentation

## 2014-09-24 DIAGNOSIS — R634 Abnormal weight loss: Secondary | ICD-10-CM | POA: Insufficient documentation

## 2014-09-24 NOTE — Assessment & Plan Note (Signed)
Due to increased anxiety, with appetite loss. Pt now aware of the extent of the effect of her reduced intake and will work on eating habits Now that she will be relocating in the next week, this will also get her mentally and emotionally settled. i spoke directly with her daughter on telephone to make her aware of this and other problems that will need close follow up

## 2014-09-24 NOTE — Progress Notes (Signed)
Subjective:    Patient ID: Amanda [name], female    DOB: 14-Aug-1935, 79 y.o.   MRN: [id number]  HPI The PT is here for follow up and re-evaluation of chronic medical conditions, medication management and review of any available recent lab and radiology data.  Preventive health is updated, specifically  Cancer screening and Immunization.   Continues to have her vision closely followed and treated by her opthalmolopgist The PT denies any adverse reactions to current medications since the last visit.  She has  Been extremely anxious, poor apetite and poor sleep in past several; months, will be relocating to [place] to live nearer to her daughter , which she needs to do, but has anxiety about getting everything packed, and also tearfully states she will miss friends in [place] and now has to find  New health care Providers. She sees cardiology annually, eye specialist for glaucoma and myself. I assure her that because she is so invested in maintaining good health , that in turn , she will continue to receive good care, and that her daughter will see to this.   Review of Systems See HPI Denies recent fever or chills. Denies sinus pressure, nasal congestion, ear pain or sore throat. Denies chest congestion, productive cough or wheezing. Denies chest pains, palpitations and leg swelling Denies abdominal pain, nausea, vomiting,diarrhea or constipation.  Denies change in stool caliber. Reports poor appetite , she thinks primarily due to uncontrolled anxiety. With which i agree Denies dysuria, frequency, hesitancy or incontinence. Denies joint pain, swelling and limitation in mobility. Denies headaches, seizures, numbness, or tingling.  Denies skin break down or rash.        Objective:   Physical Exam BP 132/80 mmHg  Pulse 71  Resp 16  Ht 5\' 2"  (1.575 m)  Wt 130 lb (58.968 kg)  BMI 23.77 kg/m2  SpO2 99% Patient alert and oriented and in no cardiopulmonary distress.  HEENT: No facial  asymmetry, EOMI,   oropharynx pink and moist.  Neck supple no JVD, no mass.  Chest: Clear to auscultation bilaterally.  CVS: S1, S2 no murmurs, no S3.Regular rate.  ABD: Soft non tender.No organomegaly or masses palpable. Normal bowel sounds. No rebound or  guarding   Rectal: no mass, heme negative stool  Ext: No edema  MS: Adequate ROM spine, shoulders, hips and knees.  Skin: Intact, no ulcerations or rash noted.  Psych: Good eye contact, tearful affect. Memory intact  anxious and mildly depressed appearing.  CNS: CN 2-12 intact, power,  normal throughout.no focal deficits noted.        Assessment & Plan:  Essential hypertension Controlled, no change in medication DASH diet and commitment to daily physical activity for a minimum of 30 minutes discussed and encouraged, as a part of hypertension management.  BP/Weight 09/06/2014 05/17/2014 05/16/2014 11/24/2013 06/02/2013 36/06/4429 10/17/84  Systolic BP 761 950 932 671 [phone]  Diastolic BP 80 72 70 78 82 74 62  Wt. (Lbs) 130 138.12 139.4 141.8 141 140.12 140  BMI 23.77 25.26 25.49 25.93 25.78 24.04 24.02         Hyperlipidemia LDL goal <100 Not at goal, has been missing doses, unsure if OK to take with restoril, I have advised her to take every night, approx 2 hrs before the restoril  Hyperlipidemia:Low fat diet discussed and encouraged.   Lipid Panel  Lab Results  Component Value Date   CHOL 204* 08/30/2014   HDL 71 08/30/2014   LDLCALC 123* 08/30/2014  TRIG 49 08/30/2014   CHOLHDL 2.9 08/30/2014         Insomnia secondary to depression with anxiety Increased anxiety and stress in past 3 to 4 months, now relocating Not eating well, not hungry, weight loss, encouraged to focus on eating regularly Sleep hygiene reviewed and written information offered also. Prescription sent for  medication needed.    Unintentional weight loss Due to increased anxiety, with appetite loss. Pt now aware of the  extent of the effect of her reduced intake and will work on eating habits Now that she will be relocating in the next week, this will also get her mentally and emotionally settled. i spoke directly with her daughter on telephone to make her aware of this and other problems that will need close follow up   Anxiety Pt visibly anxious and tearful during the visit, but this I believe is due to the upcoming changes in her living situation, which are for her best. No medication, indicated at thsi time, psychological support and awareness from her daughter will I believe be more than adequate   Hematologic abnormality Mild leukopenia noted in 2015, and again this year, to a greater extent, pt has had no recurrent infections. She has  had mild anemia for over 2 years.Stool checked at today's visit and stool is heme negative. I have advised dfdaily iron and tonic supplement  This needs follow up, she may also need to see hematologist in the future if no response in the next 4 to 6 months

## 2014-09-24 NOTE — Assessment & Plan Note (Signed)
Controlled, no change in medication DASH diet and commitment to daily physical activity for a minimum of 30 minutes discussed and encouraged, as a part of hypertension management.  BP/Weight 09/06/2014 05/17/2014 05/16/2014 11/24/2013 06/02/2013 15/06/8341 12/17/5787  Systolic BP 784 784 128 208 138 871 959  Diastolic BP 80 72 70 78 82 74 62  Wt. (Lbs) 130 138.12 139.4 141.8 141 140.12 140  BMI 23.77 25.26 25.49 25.93 25.78 24.04 24.02

## 2014-09-24 NOTE — Assessment & Plan Note (Signed)
Pt visibly anxious and tearful during the visit, but this I believe is due to the upcoming changes in her living situation, which are for her best. No medication, indicated at thsi time, psychological support and awareness from her daughter will I believe be more than adequate

## 2014-09-24 NOTE — Assessment & Plan Note (Signed)
Increased anxiety and stress in past 3 to 4 months, now relocating Not eating well, not hungry, weight loss, encouraged to focus on eating regularly Sleep hygiene reviewed and written information offered also. Prescription sent for  medication needed.

## 2014-09-24 NOTE — Assessment & Plan Note (Addendum)
Mild leukopenia noted in 2015, and again this year, to a greater extent, pt has had no recurrent infections. She has  had mild anemia for over 2 years.Stool checked at today's visit and stool is heme negative. I have advised dfdaily iron and tonic supplement  This needs follow up, she may also need to see hematologist in the future if no response in the next 4 to 6 months

## 2014-09-24 NOTE — Assessment & Plan Note (Signed)
Not at goal, has been missing doses, unsure if OK to take with restoril, I have advised her to take every night, approx 2 hrs before the restoril  Hyperlipidemia:Low fat diet discussed and encouraged.   Lipid Panel  Lab Results  Component Value Date   CHOL 204* 08/30/2014   HDL 71 08/30/2014   LDLCALC 123* 08/30/2014   TRIG 49 08/30/2014   CHOLHDL 2.9 08/30/2014

## 2014-10-02 ENCOUNTER — Encounter: Payer: Medicare Other | Admitting: Family Medicine

## 2014-11-20 ENCOUNTER — Other Ambulatory Visit: Payer: Self-pay | Admitting: Family Medicine

## 2016-11-12 ENCOUNTER — Telehealth: Payer: Self-pay | Admitting: Family Medicine

## 2016-11-12 NOTE — Telephone Encounter (Signed)
Former patient of Dr. Moshe Cipro and recently moved to [facility] left message on nurse line today wanting Dr Moshe Cipro to call her.  I called back to get detail, but phone continues to ring with no answer.  [phone]

## 2016-11-12 NOTE — Telephone Encounter (Signed)
Spoke with daughter Lorenso Courier  States her Mom is depressed , not eating and having problems with her blood pressure meds, enalapril causes dry cough , I will advise her of the need to call the treating MD that she is coughing excessively with the enalapril, and she will need to be on medication to control her blood pressure

## 2019-06-01 ENCOUNTER — Encounter: Payer: Self-pay | Admitting: Internal Medicine
